# Patient Record
Sex: Male | Born: 1950
Health system: Southern US, Community
[De-identification: ages and names within clinical notes are randomized; demographics above are authoritative.]

## PROBLEM LIST (undated history)

## (undated) DIAGNOSIS — K649 Unspecified hemorrhoids: Secondary | ICD-10-CM

## (undated) DIAGNOSIS — I10 Essential (primary) hypertension: Secondary | ICD-10-CM

## (undated) DIAGNOSIS — K219 Gastro-esophageal reflux disease without esophagitis: Secondary | ICD-10-CM

## (undated) DIAGNOSIS — N400 Enlarged prostate without lower urinary tract symptoms: Secondary | ICD-10-CM

## (undated) DIAGNOSIS — E785 Hyperlipidemia, unspecified: Secondary | ICD-10-CM

## (undated) HISTORY — DX: Benign prostatic hyperplasia without lower urinary tract symptoms: N40.0

## (undated) HISTORY — PX: HEMORRHOID SURGERY: SHX153

## (undated) HISTORY — DX: Hyperlipidemia, unspecified: E78.5

## (undated) HISTORY — PX: CIRCUMCISION: SUR203

## (undated) HISTORY — DX: Essential (primary) hypertension: I10

## (undated) HISTORY — DX: Gastro-esophageal reflux disease without esophagitis: K21.9

## (undated) HISTORY — PX: HAND SURGERY: SHX662

## (undated) HISTORY — DX: Unspecified hemorrhoids: K64.9

## (undated) HISTORY — PX: UMBILICAL HERNIA REPAIR: SHX2598

---

## 2002-03-24 ENCOUNTER — Ambulatory Visit (HOSPITAL_COMMUNITY): Admission: RE | Admit: 2002-03-24 | Discharge: 2002-03-24 | Payer: Self-pay | Admitting: Urology

## 2002-08-20 ENCOUNTER — Encounter: Payer: Self-pay | Admitting: Family Medicine

## 2002-08-20 ENCOUNTER — Ambulatory Visit (HOSPITAL_COMMUNITY): Admission: RE | Admit: 2002-08-20 | Discharge: 2002-08-20 | Payer: Self-pay | Admitting: Family Medicine

## 2002-09-15 ENCOUNTER — Encounter (HOSPITAL_COMMUNITY): Admission: RE | Admit: 2002-09-15 | Discharge: 2002-10-15 | Payer: Self-pay | Admitting: *Deleted

## 2002-09-15 ENCOUNTER — Encounter: Payer: Self-pay | Admitting: *Deleted

## 2009-11-15 ENCOUNTER — Ambulatory Visit (HOSPITAL_COMMUNITY): Admission: RE | Admit: 2009-11-15 | Discharge: 2009-11-15 | Payer: Self-pay | Admitting: Family Medicine

## 2009-11-19 ENCOUNTER — Ambulatory Visit (HOSPITAL_COMMUNITY): Admission: RE | Admit: 2009-11-19 | Discharge: 2009-11-19 | Payer: Self-pay | Admitting: Family Medicine

## 2009-11-23 ENCOUNTER — Ambulatory Visit (HOSPITAL_COMMUNITY): Admission: RE | Admit: 2009-11-23 | Discharge: 2009-11-23 | Payer: Self-pay | Admitting: Orthopedic Surgery

## 2010-01-04 ENCOUNTER — Ambulatory Visit (HOSPITAL_COMMUNITY): Admission: RE | Admit: 2010-01-04 | Discharge: 2010-01-04 | Payer: Self-pay | Admitting: Orthopedic Surgery

## 2011-02-10 NOTE — Op Note (Signed)
[facility]  Patient:    Hunter Cuevas, Hunter Cuevas Visit Number: [id number] MRN: [id number]          Service Type: DSU Location: DAY Attending Physician:  Dennie Maizes Dictated by:   Dennie Maizes, M.D. Proc. Date: 03/24/02 Admit Date:  03/24/2002   CC:         Patrica Duel, M.D.   Operative Report  PREOPERATIVE DIAGNOSIS:  Recurrent balanitis.  POSTOPERATIVE DIAGNOSIS:  Recurrent balanitis.  OPERATIVE PROCEDURE:  Circumcision.  SURGEON:  Dennie Maizes, M.D.  ANESTHESIA:  Spinal and general.  COMPLICATIONS:  None.  ESTIMATED BLOOD LOSS:  Minimal.  INDICATIONS FOR PROCEDURE:  This 60 year old male with a history of recurrent balanitis was taken to the operating room today for circumcision under anesthesia.  DESCRIPTION OF PROCEDURE:  Spinal and general anesthesia were induced and the patient was placed on the OR table in the supine position.  The lower abdomen and genitalia were prepped and draped in a sterile fashion.  The foreskin was then clamped in 6 and 12 oclock positions with the straight hemostats. Dorsal and ventral slits were made.  The redundant foreskin was then excised. Hemostasis was obtained by cauterization.  The edges of the foreskin were then approximated using 4-0 chromic gut.  Vaseline gauze dressing and Coban were applied to the penis.  The patient was transferred to the PACU in a satisfactory condition.  ADDENDUM:  About 3 cc of 0.25% Marcaine were infiltrated subcutaneously around the base of the penis for postoperative analgesia prior to application of the dressing. Dictated by:   Dennie Maizes, M.D. Attending Physician:  Dennie Maizes DD:  03/24/02 TD:  03/25/02 Job: [id number]

## 2011-02-10 NOTE — Op Note (Signed)
   NAME:  DARRIE, MACMILLAN                         ACCOUNT NO.:  [id number]   MEDICAL RECORD NO.:  [id number]                   PATIENT TYPE:  OUT   LOCATION:                                       FACILITY:  [facility]   PHYSICIAN:  Cecil Cranker, M.D. [facility]         DATE OF BIRTH:  03/15/1951   DATE OF PROCEDURE:  09/15/2002  DATE OF DISCHARGE:                                  PROCEDURE NOTE   INDICATION:  Mr. Hohensee is a 60 year old male with multiple cardiac risk  factors; however, no known coronary disease. He presented to our office  complaining of sharp pain under the left arm radiating to the chest. He is  here for evaluation of his chest pain with exercise Cardiolite.   Baseline EKG:  Sinus rhythm at 69 beats per minute, no ischemic changes.  Baseline blood pressure:  158/92.   SUMMARY:  Mr. Bachtel exercised for a total of 5 minutes and 53 seconds to a  Bruce protocol stage II and 7 METS. Cardiolite was injected at 4 minutes and  53 seconds. His target heart rate was 142 beats per minute. He reached a  maximum heart rate of 144 beats per minute. He denies any chest pain during  exercise; however, he did experience some shortness of breath relieved with  rest. There were no ischemic changes on EKG, no arrhythmias noted. His  maximum blood pressure was 220/112 with maximum heart rate at 144 beats per  minute. Final images and results are pending M.D. review.     Amy Mercy Riding, P.A. [facility]                     E. Graceann Congress, M.D. [facility]    AB/MEDQ  D:  09/15/2002  T:  09/16/2002  Job:  [id number]

## 2011-09-28 IMAGING — US US EXTREM LOW VENOUS*L*
1 series · 14 of 24 positions shown · non-contrast
Comparison: None

CLINICAL DATA: LT KNEE/LEG PAIN/EDEMA;;

LEFT LOWER EXTREMITY VENOUS DUPLEX ULTRASOUND
TECHNIQUE: Gray-scale sonography with compression, as well as color
and duplex ultrasound, were performed to evaluate the deep venous
system from the level of the common femoral vein through the
popliteal and proximal calf veins.

[Series 1: unknown · 14 of 26 slices shown]
[im 1/26]
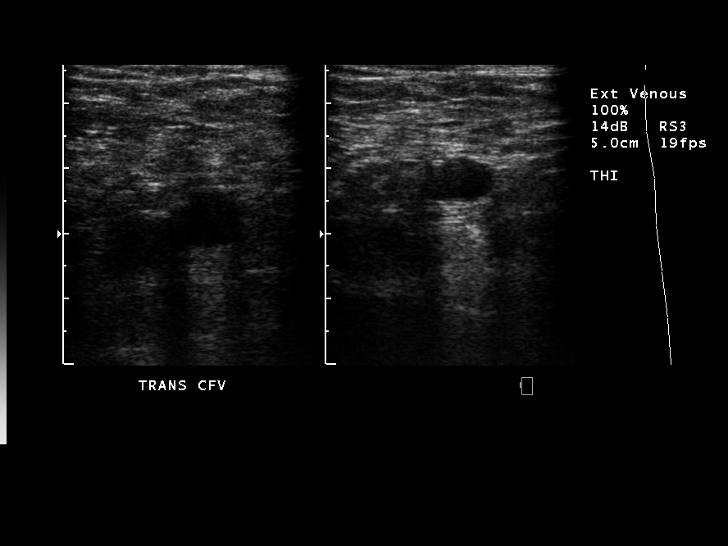
[im 3/26]
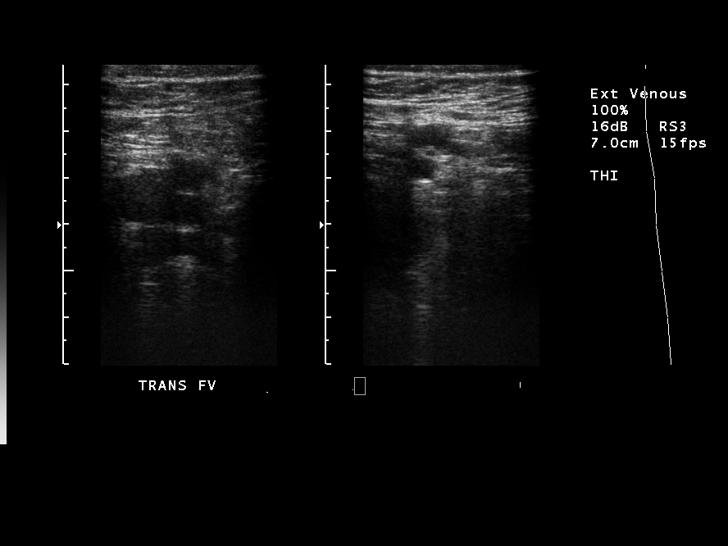
[im 5/26]
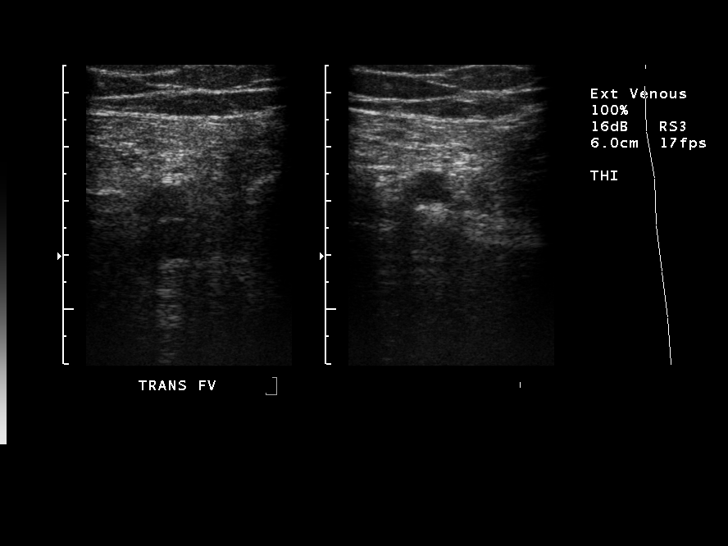
[im 7/26]
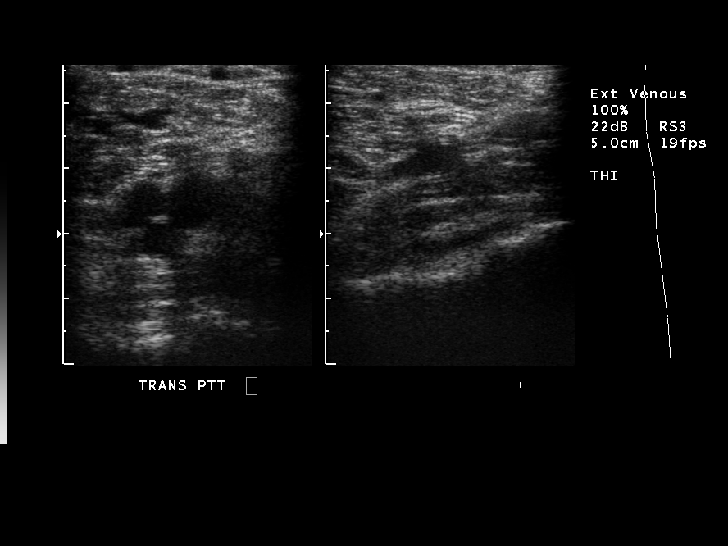
[im 8/26]
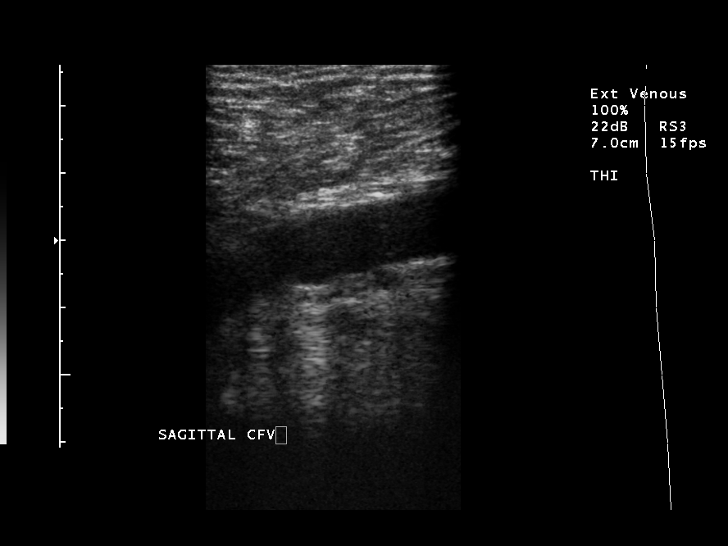
[im 10/26]
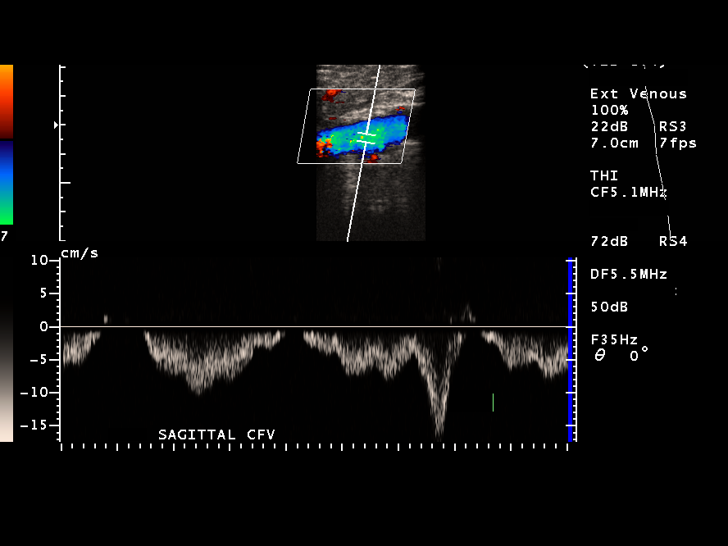
[im 12/26]
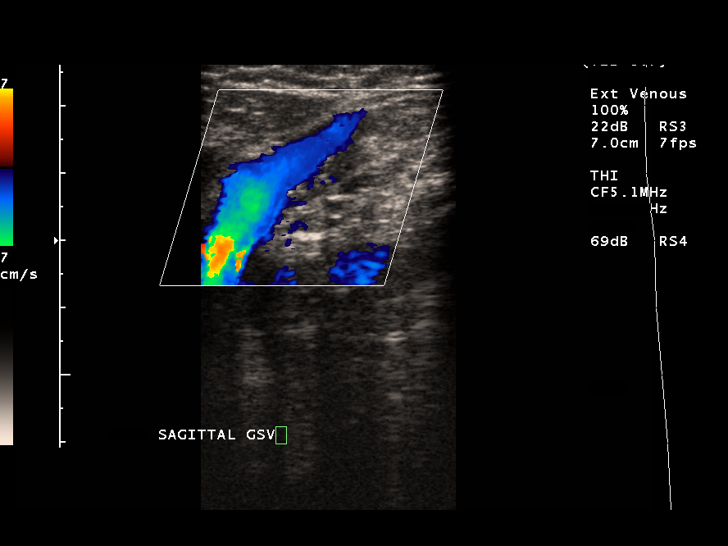
[im 14/26]
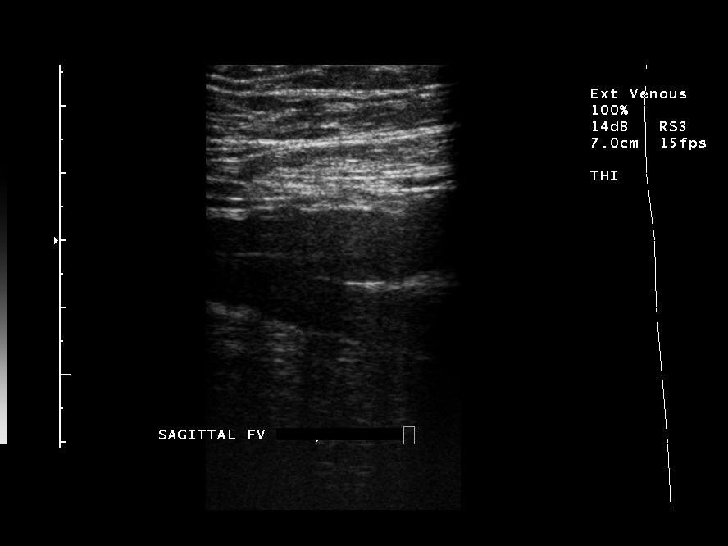
[im 16/26]
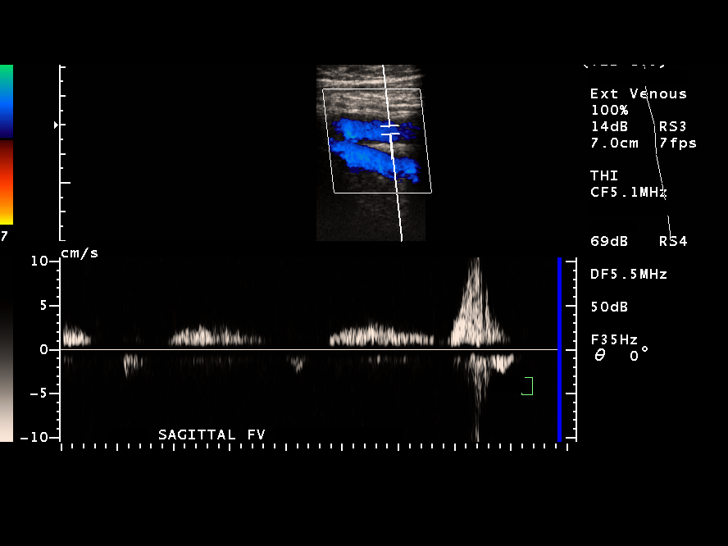
[im 18/26]
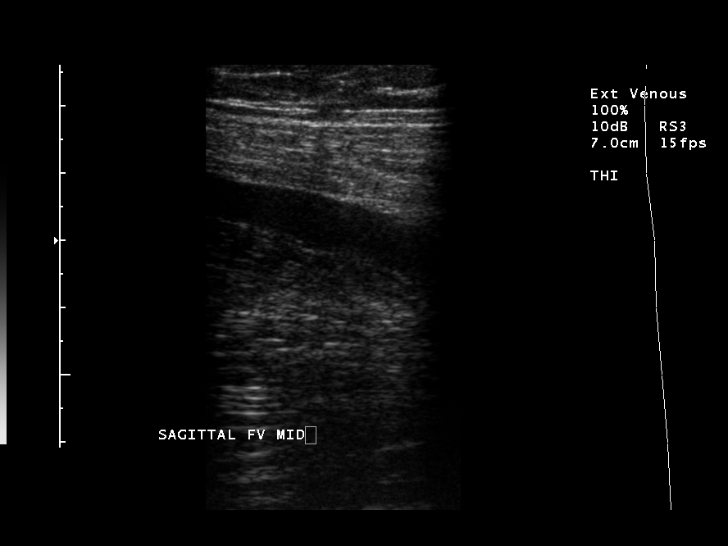
[im 20/26]
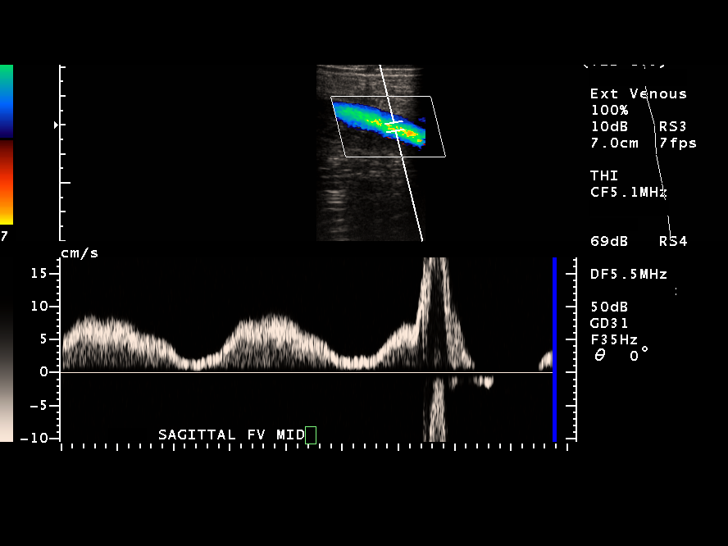
[im 21/26]
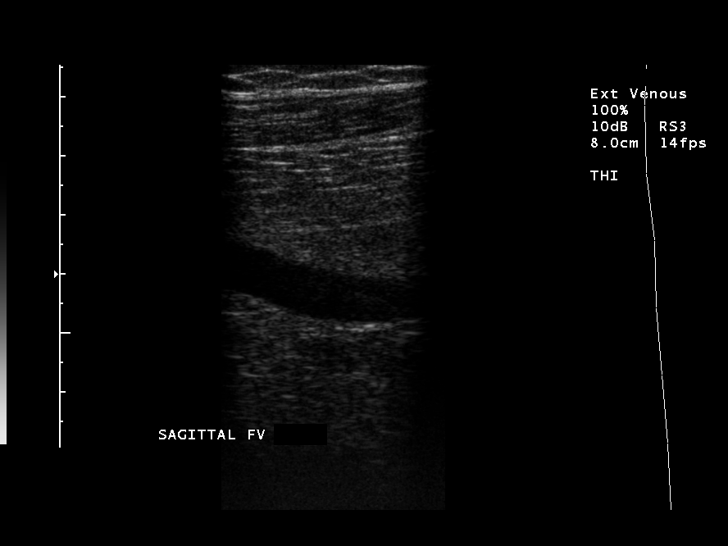
[im 23/26]
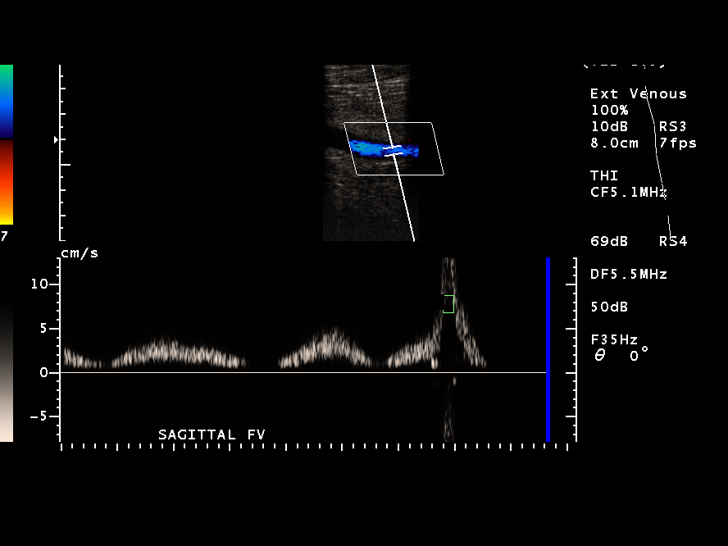
[im 26/26]
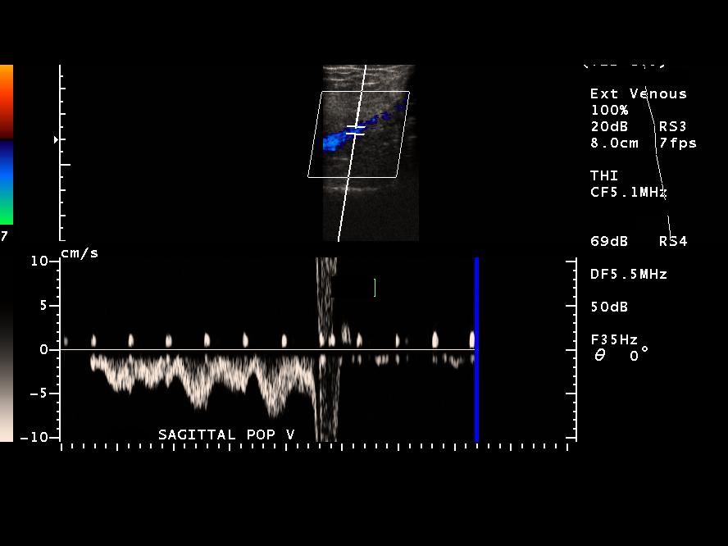

[14 of 24 positions shown; findings below may reference images not displayed]

FINDINGS: Normal compressibility and normal Doppler signal within
the common femoral, superficial femoral and popliteal veins, down
to the proximal calf veins.  No grayscale filling defects to
suggest DVT.
IMPRESSION: No evidence of left lower extremity deep vein thrombosis.

## 2013-04-08 ENCOUNTER — Ambulatory Visit: Payer: Self-pay | Admitting: Internal Medicine

## 2013-04-08 ENCOUNTER — Telehealth: Payer: Self-pay | Admitting: Internal Medicine

## 2013-04-08 NOTE — Telephone Encounter (Signed)
Pt was a no show

## 2013-07-09 ENCOUNTER — Other Ambulatory Visit (HOSPITAL_COMMUNITY): Payer: Self-pay | Admitting: Neurological Surgery

## 2013-07-09 DIAGNOSIS — M419 Scoliosis, unspecified: Secondary | ICD-10-CM

## 2013-07-14 ENCOUNTER — Ambulatory Visit (HOSPITAL_COMMUNITY)
Admission: RE | Admit: 2013-07-14 | Discharge: 2013-07-14 | Disposition: A | Discharge status: Home / Self Care | Payer: Managed Care, Other (non HMO) | Source: Ambulatory Visit | Attending: Neurological Surgery | Admitting: Neurological Surgery

## 2013-07-14 ENCOUNTER — Other Ambulatory Visit (HOSPITAL_COMMUNITY): Payer: Self-pay

## 2013-07-14 DIAGNOSIS — M545 Low back pain, unspecified: Secondary | ICD-10-CM | POA: Insufficient documentation

## 2013-07-14 DIAGNOSIS — M419 Scoliosis, unspecified: Secondary | ICD-10-CM

## 2013-07-14 DIAGNOSIS — M5126 Other intervertebral disc displacement, lumbar region: Secondary | ICD-10-CM | POA: Insufficient documentation

## 2013-09-01 ENCOUNTER — Encounter: Payer: Self-pay | Admitting: Cardiology

## 2015-12-28 DIAGNOSIS — N400 Enlarged prostate without lower urinary tract symptoms: Secondary | ICD-10-CM | POA: Diagnosis not present

## 2015-12-28 DIAGNOSIS — I10 Essential (primary) hypertension: Secondary | ICD-10-CM | POA: Diagnosis not present

## 2015-12-28 DIAGNOSIS — K219 Gastro-esophageal reflux disease without esophagitis: Secondary | ICD-10-CM | POA: Diagnosis not present

## 2015-12-28 DIAGNOSIS — E782 Mixed hyperlipidemia: Secondary | ICD-10-CM | POA: Diagnosis not present

## 2015-12-28 DIAGNOSIS — Z1389 Encounter for screening for other disorder: Secondary | ICD-10-CM | POA: Diagnosis not present

## 2016-04-11 DIAGNOSIS — K219 Gastro-esophageal reflux disease without esophagitis: Secondary | ICD-10-CM | POA: Diagnosis not present

## 2016-04-11 DIAGNOSIS — Z1389 Encounter for screening for other disorder: Secondary | ICD-10-CM | POA: Diagnosis not present

## 2016-04-11 DIAGNOSIS — R7309 Other abnormal glucose: Secondary | ICD-10-CM | POA: Diagnosis not present

## 2016-04-11 DIAGNOSIS — E748 Other specified disorders of carbohydrate metabolism: Secondary | ICD-10-CM | POA: Diagnosis not present

## 2016-04-11 DIAGNOSIS — E6609 Other obesity due to excess calories: Secondary | ICD-10-CM | POA: Diagnosis not present

## 2016-04-11 DIAGNOSIS — Z6830 Body mass index (BMI) 30.0-30.9, adult: Secondary | ICD-10-CM | POA: Diagnosis not present

## 2016-04-11 DIAGNOSIS — R5383 Other fatigue: Secondary | ICD-10-CM | POA: Diagnosis not present

## 2016-04-11 DIAGNOSIS — Z0001 Encounter for general adult medical examination with abnormal findings: Secondary | ICD-10-CM | POA: Diagnosis not present

## 2016-04-11 DIAGNOSIS — N400 Enlarged prostate without lower urinary tract symptoms: Secondary | ICD-10-CM | POA: Diagnosis not present

## 2016-05-31 DIAGNOSIS — Z23 Encounter for immunization: Secondary | ICD-10-CM | POA: Diagnosis not present

## 2016-07-18 DIAGNOSIS — H5203 Hypermetropia, bilateral: Secondary | ICD-10-CM | POA: Diagnosis not present

## 2016-07-18 DIAGNOSIS — H2513 Age-related nuclear cataract, bilateral: Secondary | ICD-10-CM | POA: Diagnosis not present

## 2016-07-18 DIAGNOSIS — H524 Presbyopia: Secondary | ICD-10-CM | POA: Diagnosis not present

## 2016-07-18 DIAGNOSIS — H52223 Regular astigmatism, bilateral: Secondary | ICD-10-CM | POA: Diagnosis not present

## 2016-08-07 ENCOUNTER — Emergency Department (HOSPITAL_COMMUNITY): Payer: Medicare Other

## 2016-08-07 ENCOUNTER — Emergency Department (HOSPITAL_COMMUNITY)
Admission: EM | Admit: 2016-08-07 | Discharge: 2016-08-07 | Disposition: A | Discharge status: Home / Self Care | Payer: Medicare Other | Attending: Emergency Medicine | Admitting: Emergency Medicine

## 2016-08-07 ENCOUNTER — Encounter (HOSPITAL_COMMUNITY): Payer: Self-pay | Admitting: Emergency Medicine

## 2016-08-07 DIAGNOSIS — K5790 Diverticulosis of intestine, part unspecified, without perforation or abscess without bleeding: Secondary | ICD-10-CM | POA: Diagnosis not present

## 2016-08-07 DIAGNOSIS — R1110 Vomiting, unspecified: Secondary | ICD-10-CM | POA: Insufficient documentation

## 2016-08-07 DIAGNOSIS — I10 Essential (primary) hypertension: Secondary | ICD-10-CM | POA: Diagnosis not present

## 2016-08-07 DIAGNOSIS — Z79899 Other long term (current) drug therapy: Secondary | ICD-10-CM | POA: Diagnosis not present

## 2016-08-07 DIAGNOSIS — Z1389 Encounter for screening for other disorder: Secondary | ICD-10-CM | POA: Diagnosis not present

## 2016-08-07 DIAGNOSIS — R112 Nausea with vomiting, unspecified: Secondary | ICD-10-CM | POA: Diagnosis not present

## 2016-08-07 DIAGNOSIS — R1013 Epigastric pain: Secondary | ICD-10-CM | POA: Insufficient documentation

## 2016-08-07 DIAGNOSIS — R197 Diarrhea, unspecified: Secondary | ICD-10-CM | POA: Diagnosis not present

## 2016-08-07 DIAGNOSIS — Z87891 Personal history of nicotine dependence: Secondary | ICD-10-CM | POA: Insufficient documentation

## 2016-08-07 LAB — COMPREHENSIVE METABOLIC PANEL
ALT: 18 U/L (ref 17–63)
AST: 16 U/L (ref 15–41)
Albumin: 4.9 g/dL (ref 3.5–5.0)
Alkaline Phosphatase: 73 U/L (ref 38–126)
Anion gap: 11 (ref 5–15)
BUN: 16 mg/dL (ref 6–20)
CHLORIDE: 102 mmol/L (ref 101–111)
CO2: 27 mmol/L (ref 22–32)
CREATININE: 0.92 mg/dL (ref 0.61–1.24)
Calcium: 10.4 mg/dL — ABNORMAL HIGH (ref 8.9–10.3)
Glucose, Bld: 99 mg/dL (ref 65–99)
Potassium: 4.1 mmol/L (ref 3.5–5.1)
Sodium: 140 mmol/L (ref 135–145)
Total Bilirubin: 1 mg/dL (ref 0.3–1.2)
Total Protein: 8 g/dL (ref 6.5–8.1)

## 2016-08-07 LAB — CBC WITH DIFFERENTIAL/PLATELET
BASOS ABS: 0 10*3/uL (ref 0.0–0.1)
BASOS PCT: 0 %
EOS ABS: 0 10*3/uL (ref 0.0–0.7)
EOS PCT: 0 %
HCT: 48.6 % (ref 39.0–52.0)
Hemoglobin: 16.5 g/dL (ref 13.0–17.0)
LYMPHS PCT: 22 %
Lymphs Abs: 2.2 10*3/uL (ref 0.7–4.0)
MCH: 31.2 pg (ref 26.0–34.0)
MCHC: 34 g/dL (ref 30.0–36.0)
MCV: 91.9 fL (ref 78.0–100.0)
Monocytes Absolute: 1 10*3/uL (ref 0.1–1.0)
Monocytes Relative: 10 %
Neutro Abs: 6.7 10*3/uL (ref 1.7–7.7)
Neutrophils Relative %: 68 %
PLATELETS: 286 10*3/uL (ref 150–400)
RBC: 5.29 MIL/uL (ref 4.22–5.81)
RDW: 12.6 % (ref 11.5–15.5)
WBC: 9.9 10*3/uL (ref 4.0–10.5)

## 2016-08-07 LAB — LIPASE, BLOOD: LIPASE: 28 U/L (ref 11–51)

## 2016-08-07 MED ORDER — IOPAMIDOL (ISOVUE-300) INJECTION 61%
INTRAVENOUS | Status: AC
  Administered 2016-08-07: 30 mL
  Filled 2016-08-07: qty 30

## 2016-08-07 MED ORDER — IOPAMIDOL (ISOVUE-300) INJECTION 61%
100.0000 mL | Freq: Once | INTRAVENOUS | Status: AC | PRN
  Administered 2016-08-07: 100 mL via INTRAVENOUS

## 2016-08-07 MED ORDER — ONDANSETRON 8 MG PO TBDP: ORAL_TABLET | ORAL | 0 refills | Status: DC

## 2016-08-07 MED ORDER — PROMETHAZINE HCL 25 MG/ML IJ SOLN
12.5000 mg | Freq: Once | INTRAMUSCULAR | Status: AC
  Administered 2016-08-07: 12.5 mg via INTRAVENOUS
  Filled 2016-08-07: qty 1

## 2016-08-07 MED ORDER — SODIUM CHLORIDE 0.9 % IV BOLUS (SEPSIS)
1000.0000 mL | Freq: Once | INTRAVENOUS | Status: AC
  Administered 2016-08-07: 1000 mL via INTRAVENOUS

## 2016-08-07 MED ORDER — ONDANSETRON HCL 4 MG/2ML IJ SOLN
4.0000 mg | Freq: Once | INTRAMUSCULAR | Status: AC
  Administered 2016-08-07: 4 mg via INTRAVENOUS
  Filled 2016-08-07: qty 2

## 2016-08-07 MED ORDER — OMEPRAZOLE 20 MG PO CPDR: 20.0000 mg | DELAYED_RELEASE_CAPSULE | Freq: Every day | ORAL | 1 refills | Status: DC

## 2016-08-07 MED ORDER — KETOROLAC TROMETHAMINE 30 MG/ML IJ SOLN
30.0000 mg | Freq: Once | INTRAMUSCULAR | Status: AC
  Administered 2016-08-07: 30 mg via INTRAVENOUS
  Filled 2016-08-07: qty 1

## 2016-08-07 NOTE — Discharge Instructions (Signed)
Prilosec as prescribed. Zofran as prescribed as needed for nausea.  Return to the emergency department if you develop worsening pain, high fever, bloody stools, or other new and concerning symptoms.

## 2016-08-07 NOTE — ED Provider Notes (Signed)
[facility] DEPT Provider Note   CSN: [id number] Arrival date & time: 08/07/16  [id number]  By signing my name below, I, Higinio Plan, attest that this documentation has been prepared under the direction and in the presence of Veryl Speak, MD . Electronically Signed: Higinio Plan, Scribe. 08/07/2016. 8:40 AM.  History   Chief Complaint Chief Complaint  Patient presents with  . Emesis   The history is provided by the patient. No language interpreter was used.   HPI Comments: Hunter Cuevas is a 65 y.o. male with PMHx of HTN and HLD, who presents to the Emergency Department complaining of gradually worsening, epigastric abdominal pain that began 4 days ago. Pt reports multiple episodes of vomiting and watery diarrhea that also began 4 days ago. He notes he has not been able to keep any foods or liquids down. He states hx of similar symptoms that he associates with a bleeding ulcer that was diagnosed at 65 years old. He reports he usually treats these episodes by "not eating spicy foods and it will eventually disappear on its own." He denies sick contacts, fever and PSHx to his abdomen.  Past Medical History:  Diagnosis Date  . BPH (benign prostatic hyperplasia)   . Hemorrhoids   . HTN (hypertension)   . Hyperlipidemia, mild    There are no active problems to display for this patient.  Past Surgical History:  Procedure Laterality Date  . CIRCUMCISION    . HAND SURGERY     R and L  . HEMORRHOID SURGERY      Home Medications    Prior to Admission medications   Not on File    Family History No family history on file.  Social History Social History  Substance Use Topics  . Smoking status: Former Research scientist (life sciences)  . Smokeless tobacco: Never Used  . Alcohol use No   Allergies   Penicillins  Review of Systems Review of Systems  Constitutional: Negative for fever.  Gastrointestinal: Positive for abdominal pain, diarrhea and vomiting.   Physical Exam Updated Vital Signs BP 129/99  (BP Location: Right Arm)   Pulse (!) 58   Temp 97.8 F (36.6 C) (Oral)   Resp 20   Ht 5\' 9"  (1.753 m)   Wt 178 lb (80.7 kg)   SpO2 97%   BMI 26.29 kg/m   Physical Exam  Constitutional: He is oriented to person, place, and time. He appears well-developed and well-nourished.  HENT:  Head: Normocephalic and atraumatic.  Mouth/Throat: Oropharynx is clear and moist.  Eyes: Conjunctivae and EOM are normal.  Neck: Normal range of motion. No tracheal deviation present.  Cardiovascular: Normal rate, regular rhythm, normal heart sounds and intact distal pulses.   No murmur heard. Pulmonary/Chest: Effort normal and breath sounds normal. No respiratory distress.  Abdominal: Soft. He exhibits no distension. There is no tenderness. There is no rebound and no guarding.  TTP in epigastric region  Musculoskeletal: Normal range of motion.  Neurological: He is alert and oriented to person, place, and time.  Skin: Skin is warm and dry.  Psychiatric: He has a normal mood and affect. Judgment normal.  Nursing note and vitals reviewed.  ED Treatments / Results  Labs (all labs ordered are listed, but only abnormal results are displayed) Labs Reviewed - No data to display  EKG  EKG Interpretation None       Radiology No results found.  Procedures Procedures (including critical care time)  Medications Ordered in ED Medications - No data to  display  DIAGNOSTIC STUDIES:  Oxygen Saturation is 97% on RA, normal by my interpretation.    COORDINATION OF CARE:  8:37 AM Discussed treatment plan with pt at bedside and pt agreed to plan.  Initial Impression / Assessment and Plan / ED Course  I have reviewed the triage vital signs and the nursing notes.  Pertinent labs & imaging results that were available during my care of the patient were reviewed by me and considered in my medical decision making (see chart for details).  Clinical Course     Patient presents here with complaints of  abdominal discomfort and vomiting. He has a history of an ulcer that he reports he has had since the age of 62. He reports occasional flareups such as this related to the ulcer. His workup today reveals no acute abnormality. He has no white count, his electrolytes and LFTs are normal, and CT scan of the abdomen and pelvis reveals no acute intra-abdominal pathology. He reports continued abdominal discomfort, however has not vomited the entire time he has been in the emergency department.  He appears clinically well, and with normal workup I believe is appropriate for discharge. He will be given Prilosec, Zofran, and advised to follow-up with his primary Dr. if he is not improving in the next 2-3 days. He is to return to the ER physician symptoms significantly worsen. His abdomen was re-examined and remains benign.  I personally performed the services described in this documentation, which was scribed in my presence. The recorded information has been reviewed and is accurate.   Final Clinical Impressions(s) / ED Diagnoses   Final diagnoses:  None    New Prescriptions New Prescriptions   No medications on file     Veryl Speak, MD 08/07/16 1340

## 2016-08-07 NOTE — ED Notes (Signed)
Pt to CT at this time.

## 2016-08-07 NOTE — ED Triage Notes (Signed)
Abdominal pain and vomiting started on Friday. Pt states he has been taking ibuprofen for arthritis pain. Pt has Hx of bleeding ulcers.

## 2016-10-17 DIAGNOSIS — Z6828 Body mass index (BMI) 28.0-28.9, adult: Secondary | ICD-10-CM | POA: Diagnosis not present

## 2016-10-17 DIAGNOSIS — J9801 Acute bronchospasm: Secondary | ICD-10-CM | POA: Diagnosis not present

## 2016-10-17 DIAGNOSIS — Z1389 Encounter for screening for other disorder: Secondary | ICD-10-CM | POA: Diagnosis not present

## 2016-10-17 DIAGNOSIS — J329 Chronic sinusitis, unspecified: Secondary | ICD-10-CM | POA: Diagnosis not present

## 2016-10-17 DIAGNOSIS — I10 Essential (primary) hypertension: Secondary | ICD-10-CM | POA: Diagnosis not present

## 2016-10-17 DIAGNOSIS — L309 Dermatitis, unspecified: Secondary | ICD-10-CM | POA: Diagnosis not present

## 2016-10-17 DIAGNOSIS — N400 Enlarged prostate without lower urinary tract symptoms: Secondary | ICD-10-CM | POA: Diagnosis not present

## 2017-04-12 DIAGNOSIS — Z23 Encounter for immunization: Secondary | ICD-10-CM | POA: Diagnosis not present

## 2017-04-12 DIAGNOSIS — Z6829 Body mass index (BMI) 29.0-29.9, adult: Secondary | ICD-10-CM | POA: Diagnosis not present

## 2017-04-12 DIAGNOSIS — Z1389 Encounter for screening for other disorder: Secondary | ICD-10-CM | POA: Diagnosis not present

## 2017-04-12 DIAGNOSIS — E663 Overweight: Secondary | ICD-10-CM | POA: Diagnosis not present

## 2017-04-12 DIAGNOSIS — K219 Gastro-esophageal reflux disease without esophagitis: Secondary | ICD-10-CM | POA: Diagnosis not present

## 2017-04-12 DIAGNOSIS — Z0001 Encounter for general adult medical examination with abnormal findings: Secondary | ICD-10-CM | POA: Diagnosis not present

## 2017-04-12 DIAGNOSIS — N400 Enlarged prostate without lower urinary tract symptoms: Secondary | ICD-10-CM | POA: Diagnosis not present

## 2017-04-12 DIAGNOSIS — I10 Essential (primary) hypertension: Secondary | ICD-10-CM | POA: Diagnosis not present

## 2017-08-08 DIAGNOSIS — Z23 Encounter for immunization: Secondary | ICD-10-CM | POA: Diagnosis not present

## 2017-08-13 DIAGNOSIS — L570 Actinic keratosis: Secondary | ICD-10-CM | POA: Diagnosis not present

## 2017-08-13 DIAGNOSIS — X32XXXA Exposure to sunlight, initial encounter: Secondary | ICD-10-CM | POA: Diagnosis not present

## 2017-08-13 DIAGNOSIS — D225 Melanocytic nevi of trunk: Secondary | ICD-10-CM | POA: Diagnosis not present

## 2018-05-14 DIAGNOSIS — I10 Essential (primary) hypertension: Secondary | ICD-10-CM | POA: Diagnosis not present

## 2018-05-14 DIAGNOSIS — E669 Obesity, unspecified: Secondary | ICD-10-CM | POA: Diagnosis not present

## 2018-05-14 DIAGNOSIS — K219 Gastro-esophageal reflux disease without esophagitis: Secondary | ICD-10-CM | POA: Diagnosis not present

## 2018-05-14 DIAGNOSIS — E782 Mixed hyperlipidemia: Secondary | ICD-10-CM | POA: Diagnosis not present

## 2018-05-14 DIAGNOSIS — Z23 Encounter for immunization: Secondary | ICD-10-CM | POA: Diagnosis not present

## 2018-05-14 DIAGNOSIS — F419 Anxiety disorder, unspecified: Secondary | ICD-10-CM | POA: Diagnosis not present

## 2018-05-14 DIAGNOSIS — N400 Enlarged prostate without lower urinary tract symptoms: Secondary | ICD-10-CM | POA: Diagnosis not present

## 2018-05-14 DIAGNOSIS — Z0000 Encounter for general adult medical examination without abnormal findings: Secondary | ICD-10-CM | POA: Diagnosis not present

## 2018-05-14 DIAGNOSIS — Z6828 Body mass index (BMI) 28.0-28.9, adult: Secondary | ICD-10-CM | POA: Diagnosis not present

## 2018-05-23 DIAGNOSIS — Z1211 Encounter for screening for malignant neoplasm of colon: Secondary | ICD-10-CM | POA: Diagnosis not present

## 2018-07-30 DIAGNOSIS — Z23 Encounter for immunization: Secondary | ICD-10-CM | POA: Diagnosis not present

## 2018-07-30 DIAGNOSIS — J329 Chronic sinusitis, unspecified: Secondary | ICD-10-CM | POA: Diagnosis not present

## 2018-07-30 DIAGNOSIS — N400 Enlarged prostate without lower urinary tract symptoms: Secondary | ICD-10-CM | POA: Diagnosis not present

## 2018-07-30 DIAGNOSIS — Z6829 Body mass index (BMI) 29.0-29.9, adult: Secondary | ICD-10-CM | POA: Diagnosis not present

## 2018-07-30 DIAGNOSIS — Z1389 Encounter for screening for other disorder: Secondary | ICD-10-CM | POA: Diagnosis not present

## 2018-07-30 DIAGNOSIS — H6591 Unspecified nonsuppurative otitis media, right ear: Secondary | ICD-10-CM | POA: Diagnosis not present

## 2019-01-01 DIAGNOSIS — Z1389 Encounter for screening for other disorder: Secondary | ICD-10-CM | POA: Diagnosis not present

## 2019-01-01 DIAGNOSIS — I10 Essential (primary) hypertension: Secondary | ICD-10-CM | POA: Diagnosis not present

## 2019-01-01 DIAGNOSIS — F419 Anxiety disorder, unspecified: Secondary | ICD-10-CM | POA: Diagnosis not present

## 2019-01-01 DIAGNOSIS — K219 Gastro-esophageal reflux disease without esophagitis: Secondary | ICD-10-CM | POA: Diagnosis not present

## 2019-01-01 DIAGNOSIS — Z6829 Body mass index (BMI) 29.0-29.9, adult: Secondary | ICD-10-CM | POA: Diagnosis not present

## 2019-01-13 DIAGNOSIS — Z1389 Encounter for screening for other disorder: Secondary | ICD-10-CM | POA: Diagnosis not present

## 2019-01-13 DIAGNOSIS — Z6828 Body mass index (BMI) 28.0-28.9, adult: Secondary | ICD-10-CM | POA: Diagnosis not present

## 2019-01-13 DIAGNOSIS — I10 Essential (primary) hypertension: Secondary | ICD-10-CM | POA: Diagnosis not present

## 2019-01-13 DIAGNOSIS — K219 Gastro-esophageal reflux disease without esophagitis: Secondary | ICD-10-CM | POA: Diagnosis not present

## 2019-01-13 DIAGNOSIS — R109 Unspecified abdominal pain: Secondary | ICD-10-CM | POA: Diagnosis not present

## 2019-01-13 DIAGNOSIS — R1013 Epigastric pain: Secondary | ICD-10-CM | POA: Diagnosis not present

## 2019-01-13 DIAGNOSIS — E663 Overweight: Secondary | ICD-10-CM | POA: Diagnosis not present

## 2019-01-13 DIAGNOSIS — K273 Acute peptic ulcer, site unspecified, without hemorrhage or perforation: Secondary | ICD-10-CM | POA: Diagnosis not present

## 2019-02-12 DIAGNOSIS — Z1389 Encounter for screening for other disorder: Secondary | ICD-10-CM | POA: Diagnosis not present

## 2019-02-12 DIAGNOSIS — Z0000 Encounter for general adult medical examination without abnormal findings: Secondary | ICD-10-CM | POA: Diagnosis not present

## 2019-02-12 DIAGNOSIS — F419 Anxiety disorder, unspecified: Secondary | ICD-10-CM | POA: Diagnosis not present

## 2019-02-12 DIAGNOSIS — Z681 Body mass index (BMI) 19 or less, adult: Secondary | ICD-10-CM | POA: Diagnosis not present

## 2019-02-12 DIAGNOSIS — E7849 Other hyperlipidemia: Secondary | ICD-10-CM | POA: Diagnosis not present

## 2019-04-10 DIAGNOSIS — H5200 Hypermetropia, unspecified eye: Secondary | ICD-10-CM | POA: Diagnosis not present

## 2019-04-10 DIAGNOSIS — Z0100 Encounter for examination of eyes and vision without abnormal findings: Secondary | ICD-10-CM | POA: Diagnosis not present

## 2019-04-15 DIAGNOSIS — I10 Essential (primary) hypertension: Secondary | ICD-10-CM | POA: Diagnosis not present

## 2019-04-15 DIAGNOSIS — Z1389 Encounter for screening for other disorder: Secondary | ICD-10-CM | POA: Diagnosis not present

## 2019-04-15 DIAGNOSIS — F17290 Nicotine dependence, other tobacco product, uncomplicated: Secondary | ICD-10-CM | POA: Diagnosis not present

## 2019-04-15 DIAGNOSIS — K219 Gastro-esophageal reflux disease without esophagitis: Secondary | ICD-10-CM | POA: Diagnosis not present

## 2019-04-15 DIAGNOSIS — Z6829 Body mass index (BMI) 29.0-29.9, adult: Secondary | ICD-10-CM | POA: Diagnosis not present

## 2019-04-15 DIAGNOSIS — Z719 Counseling, unspecified: Secondary | ICD-10-CM | POA: Diagnosis not present

## 2019-04-15 DIAGNOSIS — F419 Anxiety disorder, unspecified: Secondary | ICD-10-CM | POA: Diagnosis not present

## 2019-06-25 DIAGNOSIS — I10 Essential (primary) hypertension: Secondary | ICD-10-CM | POA: Diagnosis not present

## 2019-06-25 DIAGNOSIS — E782 Mixed hyperlipidemia: Secondary | ICD-10-CM | POA: Diagnosis not present

## 2019-06-25 DIAGNOSIS — N400 Enlarged prostate without lower urinary tract symptoms: Secondary | ICD-10-CM | POA: Diagnosis not present

## 2019-07-30 DIAGNOSIS — E6609 Other obesity due to excess calories: Secondary | ICD-10-CM | POA: Diagnosis not present

## 2019-07-30 DIAGNOSIS — Z23 Encounter for immunization: Secondary | ICD-10-CM | POA: Diagnosis not present

## 2019-07-30 DIAGNOSIS — I10 Essential (primary) hypertension: Secondary | ICD-10-CM | POA: Diagnosis not present

## 2019-07-30 DIAGNOSIS — K219 Gastro-esophageal reflux disease without esophagitis: Secondary | ICD-10-CM | POA: Diagnosis not present

## 2019-07-30 DIAGNOSIS — E7849 Other hyperlipidemia: Secondary | ICD-10-CM | POA: Diagnosis not present

## 2019-07-30 DIAGNOSIS — Z6830 Body mass index (BMI) 30.0-30.9, adult: Secondary | ICD-10-CM | POA: Diagnosis not present

## 2019-08-25 DIAGNOSIS — I10 Essential (primary) hypertension: Secondary | ICD-10-CM | POA: Diagnosis not present

## 2019-08-25 DIAGNOSIS — E7849 Other hyperlipidemia: Secondary | ICD-10-CM | POA: Diagnosis not present

## 2019-10-26 DIAGNOSIS — I10 Essential (primary) hypertension: Secondary | ICD-10-CM | POA: Diagnosis not present

## 2019-10-26 DIAGNOSIS — E7849 Other hyperlipidemia: Secondary | ICD-10-CM | POA: Diagnosis not present

## 2019-11-23 DIAGNOSIS — E7849 Other hyperlipidemia: Secondary | ICD-10-CM | POA: Diagnosis not present

## 2019-11-23 DIAGNOSIS — I10 Essential (primary) hypertension: Secondary | ICD-10-CM | POA: Diagnosis not present

## 2019-12-24 DIAGNOSIS — I10 Essential (primary) hypertension: Secondary | ICD-10-CM | POA: Diagnosis not present

## 2019-12-24 DIAGNOSIS — E7849 Other hyperlipidemia: Secondary | ICD-10-CM | POA: Diagnosis not present

## 2020-02-23 DIAGNOSIS — I10 Essential (primary) hypertension: Secondary | ICD-10-CM | POA: Diagnosis not present

## 2020-02-23 DIAGNOSIS — E7849 Other hyperlipidemia: Secondary | ICD-10-CM | POA: Diagnosis not present

## 2020-03-17 DIAGNOSIS — E7849 Other hyperlipidemia: Secondary | ICD-10-CM | POA: Diagnosis not present

## 2020-03-17 DIAGNOSIS — K219 Gastro-esophageal reflux disease without esophagitis: Secondary | ICD-10-CM | POA: Diagnosis not present

## 2020-03-17 DIAGNOSIS — M79672 Pain in left foot: Secondary | ICD-10-CM | POA: Diagnosis not present

## 2020-03-17 DIAGNOSIS — E6609 Other obesity due to excess calories: Secondary | ICD-10-CM | POA: Diagnosis not present

## 2020-03-17 DIAGNOSIS — Z0000 Encounter for general adult medical examination without abnormal findings: Secondary | ICD-10-CM | POA: Diagnosis not present

## 2020-03-17 DIAGNOSIS — F419 Anxiety disorder, unspecified: Secondary | ICD-10-CM | POA: Diagnosis not present

## 2020-03-17 DIAGNOSIS — I10 Essential (primary) hypertension: Secondary | ICD-10-CM | POA: Diagnosis not present

## 2020-03-17 DIAGNOSIS — Z6830 Body mass index (BMI) 30.0-30.9, adult: Secondary | ICD-10-CM | POA: Diagnosis not present

## 2020-03-22 ENCOUNTER — Other Ambulatory Visit: Payer: Self-pay | Admitting: Internal Medicine

## 2020-03-22 ENCOUNTER — Other Ambulatory Visit (HOSPITAL_COMMUNITY): Payer: Self-pay | Admitting: Internal Medicine

## 2020-03-22 DIAGNOSIS — M79604 Pain in right leg: Secondary | ICD-10-CM

## 2020-03-26 ENCOUNTER — Other Ambulatory Visit: Payer: Self-pay

## 2020-03-26 ENCOUNTER — Ambulatory Visit (HOSPITAL_COMMUNITY)
Admission: RE | Admit: 2020-03-26 | Discharge: 2020-03-26 | Disposition: A | Discharge status: Home / Self Care | Payer: Medicare HMO | Source: Ambulatory Visit | Attending: Internal Medicine | Admitting: Internal Medicine

## 2020-03-26 DIAGNOSIS — M79605 Pain in left leg: Secondary | ICD-10-CM | POA: Diagnosis not present

## 2020-03-26 DIAGNOSIS — Z872 Personal history of diseases of the skin and subcutaneous tissue: Secondary | ICD-10-CM | POA: Diagnosis not present

## 2020-03-26 DIAGNOSIS — M79604 Pain in right leg: Secondary | ICD-10-CM | POA: Diagnosis not present

## 2020-05-25 DIAGNOSIS — E7849 Other hyperlipidemia: Secondary | ICD-10-CM | POA: Diagnosis not present

## 2020-05-25 DIAGNOSIS — I10 Essential (primary) hypertension: Secondary | ICD-10-CM | POA: Diagnosis not present

## 2020-05-25 DIAGNOSIS — K219 Gastro-esophageal reflux disease without esophagitis: Secondary | ICD-10-CM | POA: Diagnosis not present

## 2020-07-24 DIAGNOSIS — I10 Essential (primary) hypertension: Secondary | ICD-10-CM | POA: Diagnosis not present

## 2020-07-24 DIAGNOSIS — E7849 Other hyperlipidemia: Secondary | ICD-10-CM | POA: Diagnosis not present

## 2020-08-24 DIAGNOSIS — I10 Essential (primary) hypertension: Secondary | ICD-10-CM | POA: Diagnosis not present

## 2020-08-24 DIAGNOSIS — E7849 Other hyperlipidemia: Secondary | ICD-10-CM | POA: Diagnosis not present

## 2020-09-24 DIAGNOSIS — I10 Essential (primary) hypertension: Secondary | ICD-10-CM | POA: Diagnosis not present

## 2020-09-24 DIAGNOSIS — E7849 Other hyperlipidemia: Secondary | ICD-10-CM | POA: Diagnosis not present

## 2020-10-23 DIAGNOSIS — I10 Essential (primary) hypertension: Secondary | ICD-10-CM | POA: Diagnosis not present

## 2020-10-23 DIAGNOSIS — E7849 Other hyperlipidemia: Secondary | ICD-10-CM | POA: Diagnosis not present

## 2020-10-27 DIAGNOSIS — I10 Essential (primary) hypertension: Secondary | ICD-10-CM | POA: Diagnosis not present

## 2020-10-27 DIAGNOSIS — F419 Anxiety disorder, unspecified: Secondary | ICD-10-CM | POA: Diagnosis not present

## 2020-10-27 DIAGNOSIS — K219 Gastro-esophageal reflux disease without esophagitis: Secondary | ICD-10-CM | POA: Diagnosis not present

## 2020-10-27 DIAGNOSIS — N410 Acute prostatitis: Secondary | ICD-10-CM | POA: Diagnosis not present

## 2020-10-27 DIAGNOSIS — Z6830 Body mass index (BMI) 30.0-30.9, adult: Secondary | ICD-10-CM | POA: Diagnosis not present

## 2020-10-27 DIAGNOSIS — N401 Enlarged prostate with lower urinary tract symptoms: Secondary | ICD-10-CM | POA: Diagnosis not present

## 2020-10-27 DIAGNOSIS — Z1331 Encounter for screening for depression: Secondary | ICD-10-CM | POA: Diagnosis not present

## 2020-11-09 ENCOUNTER — Encounter (INDEPENDENT_AMBULATORY_CARE_PROVIDER_SITE_OTHER): Payer: Self-pay | Admitting: *Deleted

## 2020-11-22 DIAGNOSIS — E7849 Other hyperlipidemia: Secondary | ICD-10-CM | POA: Diagnosis not present

## 2020-11-22 DIAGNOSIS — I10 Essential (primary) hypertension: Secondary | ICD-10-CM | POA: Diagnosis not present

## 2020-12-08 DIAGNOSIS — D485 Neoplasm of uncertain behavior of skin: Secondary | ICD-10-CM | POA: Diagnosis not present

## 2020-12-08 DIAGNOSIS — Z6830 Body mass index (BMI) 30.0-30.9, adult: Secondary | ICD-10-CM | POA: Diagnosis not present

## 2020-12-08 DIAGNOSIS — Z1389 Encounter for screening for other disorder: Secondary | ICD-10-CM | POA: Diagnosis not present

## 2020-12-08 DIAGNOSIS — F419 Anxiety disorder, unspecified: Secondary | ICD-10-CM | POA: Diagnosis not present

## 2020-12-08 DIAGNOSIS — K219 Gastro-esophageal reflux disease without esophagitis: Secondary | ICD-10-CM | POA: Diagnosis not present

## 2020-12-08 DIAGNOSIS — E6609 Other obesity due to excess calories: Secondary | ICD-10-CM | POA: Diagnosis not present

## 2020-12-08 DIAGNOSIS — E7849 Other hyperlipidemia: Secondary | ICD-10-CM | POA: Diagnosis not present

## 2020-12-08 DIAGNOSIS — I10 Essential (primary) hypertension: Secondary | ICD-10-CM | POA: Diagnosis not present

## 2020-12-08 DIAGNOSIS — J329 Chronic sinusitis, unspecified: Secondary | ICD-10-CM | POA: Diagnosis not present

## 2020-12-08 DIAGNOSIS — Z0000 Encounter for general adult medical examination without abnormal findings: Secondary | ICD-10-CM | POA: Diagnosis not present

## 2020-12-08 DIAGNOSIS — H6523 Chronic serous otitis media, bilateral: Secondary | ICD-10-CM | POA: Diagnosis not present

## 2021-01-03 DIAGNOSIS — L821 Other seborrheic keratosis: Secondary | ICD-10-CM | POA: Diagnosis not present

## 2021-01-03 DIAGNOSIS — L820 Inflamed seborrheic keratosis: Secondary | ICD-10-CM | POA: Diagnosis not present

## 2021-01-03 DIAGNOSIS — D225 Melanocytic nevi of trunk: Secondary | ICD-10-CM | POA: Diagnosis not present

## 2021-01-22 DIAGNOSIS — E7849 Other hyperlipidemia: Secondary | ICD-10-CM | POA: Diagnosis not present

## 2021-01-22 DIAGNOSIS — I10 Essential (primary) hypertension: Secondary | ICD-10-CM | POA: Diagnosis not present

## 2021-05-25 DIAGNOSIS — I10 Essential (primary) hypertension: Secondary | ICD-10-CM | POA: Diagnosis not present

## 2021-05-25 DIAGNOSIS — E7849 Other hyperlipidemia: Secondary | ICD-10-CM | POA: Diagnosis not present

## 2021-08-25 DIAGNOSIS — I10 Essential (primary) hypertension: Secondary | ICD-10-CM | POA: Diagnosis not present

## 2021-08-25 DIAGNOSIS — E6609 Other obesity due to excess calories: Secondary | ICD-10-CM | POA: Diagnosis not present

## 2021-08-25 DIAGNOSIS — Z6831 Body mass index (BMI) 31.0-31.9, adult: Secondary | ICD-10-CM | POA: Diagnosis not present

## 2021-08-25 DIAGNOSIS — F419 Anxiety disorder, unspecified: Secondary | ICD-10-CM | POA: Diagnosis not present

## 2021-08-25 DIAGNOSIS — M720 Palmar fascial fibromatosis [Dupuytren]: Secondary | ICD-10-CM | POA: Diagnosis not present

## 2021-08-28 DIAGNOSIS — Z1211 Encounter for screening for malignant neoplasm of colon: Secondary | ICD-10-CM | POA: Diagnosis not present

## 2021-10-20 DIAGNOSIS — E669 Obesity, unspecified: Secondary | ICD-10-CM | POA: Diagnosis not present

## 2021-10-20 DIAGNOSIS — N401 Enlarged prostate with lower urinary tract symptoms: Secondary | ICD-10-CM | POA: Diagnosis not present

## 2021-10-20 DIAGNOSIS — E538 Deficiency of other specified B group vitamins: Secondary | ICD-10-CM | POA: Diagnosis not present

## 2021-10-20 DIAGNOSIS — Z6830 Body mass index (BMI) 30.0-30.9, adult: Secondary | ICD-10-CM | POA: Diagnosis not present

## 2021-10-20 DIAGNOSIS — E559 Vitamin D deficiency, unspecified: Secondary | ICD-10-CM | POA: Diagnosis not present

## 2021-10-20 DIAGNOSIS — I10 Essential (primary) hypertension: Secondary | ICD-10-CM | POA: Diagnosis not present

## 2021-10-20 DIAGNOSIS — Z0001 Encounter for general adult medical examination with abnormal findings: Secondary | ICD-10-CM | POA: Diagnosis not present

## 2021-10-20 DIAGNOSIS — R609 Edema, unspecified: Secondary | ICD-10-CM | POA: Diagnosis not present

## 2021-10-20 DIAGNOSIS — G5620 Lesion of ulnar nerve, unspecified upper limb: Secondary | ICD-10-CM | POA: Diagnosis not present

## 2021-10-20 DIAGNOSIS — E782 Mixed hyperlipidemia: Secondary | ICD-10-CM | POA: Diagnosis not present

## 2022-04-21 DIAGNOSIS — R3129 Other microscopic hematuria: Secondary | ICD-10-CM | POA: Diagnosis not present

## 2022-04-21 DIAGNOSIS — E782 Mixed hyperlipidemia: Secondary | ICD-10-CM | POA: Diagnosis not present

## 2022-04-21 DIAGNOSIS — F419 Anxiety disorder, unspecified: Secondary | ICD-10-CM | POA: Diagnosis not present

## 2022-04-21 DIAGNOSIS — N401 Enlarged prostate with lower urinary tract symptoms: Secondary | ICD-10-CM | POA: Diagnosis not present

## 2022-04-21 DIAGNOSIS — Z125 Encounter for screening for malignant neoplasm of prostate: Secondary | ICD-10-CM | POA: Diagnosis not present

## 2022-04-21 DIAGNOSIS — E538 Deficiency of other specified B group vitamins: Secondary | ICD-10-CM | POA: Diagnosis not present

## 2022-04-21 DIAGNOSIS — E663 Overweight: Secondary | ICD-10-CM | POA: Diagnosis not present

## 2022-04-21 DIAGNOSIS — Z0001 Encounter for general adult medical examination with abnormal findings: Secondary | ICD-10-CM | POA: Diagnosis not present

## 2022-04-21 DIAGNOSIS — Z6829 Body mass index (BMI) 29.0-29.9, adult: Secondary | ICD-10-CM | POA: Diagnosis not present

## 2022-04-21 DIAGNOSIS — M720 Palmar fascial fibromatosis [Dupuytren]: Secondary | ICD-10-CM | POA: Diagnosis not present

## 2022-04-21 DIAGNOSIS — I10 Essential (primary) hypertension: Secondary | ICD-10-CM | POA: Diagnosis not present

## 2022-04-21 DIAGNOSIS — K219 Gastro-esophageal reflux disease without esophagitis: Secondary | ICD-10-CM | POA: Diagnosis not present

## 2022-04-21 DIAGNOSIS — E559 Vitamin D deficiency, unspecified: Secondary | ICD-10-CM | POA: Diagnosis not present

## 2022-05-10 DIAGNOSIS — K219 Gastro-esophageal reflux disease without esophagitis: Secondary | ICD-10-CM | POA: Diagnosis not present

## 2022-05-10 DIAGNOSIS — Z6829 Body mass index (BMI) 29.0-29.9, adult: Secondary | ICD-10-CM | POA: Diagnosis not present

## 2022-05-11 ENCOUNTER — Encounter (INDEPENDENT_AMBULATORY_CARE_PROVIDER_SITE_OTHER): Payer: Self-pay | Admitting: *Deleted

## 2022-07-31 ENCOUNTER — Ambulatory Visit (INDEPENDENT_AMBULATORY_CARE_PROVIDER_SITE_OTHER): Payer: Medicare HMO | Admitting: Gastroenterology

## 2022-09-13 DIAGNOSIS — R3129 Other microscopic hematuria: Secondary | ICD-10-CM | POA: Diagnosis not present

## 2022-09-13 DIAGNOSIS — Z6829 Body mass index (BMI) 29.0-29.9, adult: Secondary | ICD-10-CM | POA: Diagnosis not present

## 2022-09-13 DIAGNOSIS — M720 Palmar fascial fibromatosis [Dupuytren]: Secondary | ICD-10-CM | POA: Diagnosis not present

## 2022-09-13 DIAGNOSIS — F419 Anxiety disorder, unspecified: Secondary | ICD-10-CM | POA: Diagnosis not present

## 2022-09-13 DIAGNOSIS — E663 Overweight: Secondary | ICD-10-CM | POA: Diagnosis not present

## 2022-09-13 DIAGNOSIS — N401 Enlarged prostate with lower urinary tract symptoms: Secondary | ICD-10-CM | POA: Diagnosis not present

## 2022-09-13 DIAGNOSIS — K219 Gastro-esophageal reflux disease without esophagitis: Secondary | ICD-10-CM | POA: Diagnosis not present

## 2022-09-13 DIAGNOSIS — I10 Essential (primary) hypertension: Secondary | ICD-10-CM | POA: Diagnosis not present

## 2022-09-14 ENCOUNTER — Encounter: Payer: Self-pay | Admitting: Internal Medicine

## 2022-10-18 DIAGNOSIS — R300 Dysuria: Secondary | ICD-10-CM | POA: Diagnosis not present

## 2022-10-27 ENCOUNTER — Encounter: Payer: Self-pay | Admitting: Gastroenterology

## 2022-10-30 ENCOUNTER — Encounter: Payer: Self-pay | Admitting: *Deleted

## 2022-10-30 ENCOUNTER — Ambulatory Visit (INDEPENDENT_AMBULATORY_CARE_PROVIDER_SITE_OTHER): Payer: Medicare HMO | Admitting: Gastroenterology

## 2022-10-30 ENCOUNTER — Encounter: Payer: Self-pay | Admitting: Gastroenterology

## 2022-10-30 ENCOUNTER — Other Ambulatory Visit: Payer: Self-pay | Admitting: *Deleted

## 2022-10-30 VITALS — BP 134/71 | HR 56 | Temp 97.7°F | Ht 67.0 in | Wt 198.2 lb

## 2022-10-30 DIAGNOSIS — R195 Other fecal abnormalities: Secondary | ICD-10-CM | POA: Insufficient documentation

## 2022-10-30 MED ORDER — PEG 3350-KCL-NA BICARB-NACL 420 G PO SOLR: 4000.0000 mL | Freq: Once | ORAL | 0 refills | Status: AC

## 2022-10-30 NOTE — Patient Instructions (Signed)
Colonoscopy to be scheduled. See separate instructions.   It was a pleasure to see you today. I want to create trusting relationships with patients and provide genuine, compassionate, and quality care. I truly value your feedback, so please be on the lookout for a survey regarding your visit with me today. I appreciate your time in completing this!

## 2022-10-30 NOTE — Progress Notes (Signed)
GI Office Note    Referring Provider: Redmond School, MD Primary Care Physician:  Redmond School, MD  Primary Gastroenterologist: Elon Alas. Abbey Chatters, DO   Chief Complaint   Chief Complaint  Patient presents with   Colonoscopy    Positive cologuard, last colonoscopy done at [facility] over 15 years ago.     History of Present Illness   Hunter Cuevas is a 72 y.o. male presenting today at the request of Dr. Gerarda Fraction for further evaluation of positive cologuard.   Patient reports his last colonoscopy was over 15 years ago, states he was awaking it was painful therefore he is completely off.  Recently completed Cologuard which was positive.  Bowel movements are regular.  No blood in the stool or melena.  He has some vague lower abdominal pain at times prior to BM, but resolves after bowel movement.  No heartburn, dysphagia, vomiting.  History of remote bleeding ulcer in his teenage years, diagnosed with barium study.  At times he has flare of symptoms.  He has omeprazole on hand for upper GI symptoms which he takes only as needed, describes this is rare.  H. pylori breath test was negative in 2020.  Denies NSAID use.  Labs from July 2023: Glucose 97, creatinine 0.88, albumin 4.6, total bilirubin 0.4, alkaline phosphatase 80, AST 24, ALT 22, white blood cell count 8800, hemoglobin 15.8, MCV 92, platelets 267,000, B12 490, folate 18.7    Medications   Current Outpatient Medications  Medication Sig Dispense Refill   ALPRAZolam (XANAX) 1 MG tablet 1 tablet 4 (four) times daily as needed.     cephALEXin (KEFLEX) 500 MG capsule Take 500 mg by mouth 4 (four) times daily.     losartan (COZAAR) 50 MG tablet 1 tablet daily.     lovastatin (MEVACOR) 40 MG tablet 1 tablet every evening.     mirtazapine (REMERON) 15 MG tablet 1 tablet at bedtime.     omeprazole (PRILOSEC) 20 MG capsule Take 1 capsule (20 mg total) by mouth daily. 30 capsule 1   polyethylene glycol-electrolytes (NULYTELY)  420 g solution Take 4,000 mLs by mouth once for 1 dose. 4000 mL 0   valACYclovir (VALTREX) 500 MG tablet 1 tablet daily.     No current facility-administered medications for this visit.    Allergies   Allergies as of 10/30/2022 - Review Complete 10/30/2022  Allergen Reaction Noted   Penicillins Hives 08/07/2016    Past Medical History   Past Medical History:  Diagnosis Date   BPH (benign prostatic hyperplasia)    GERD (gastroesophageal reflux disease)    Hemorrhoids    HTN (hypertension)    Hyperlipidemia, mild     Past Surgical History   Past Surgical History:  Procedure Laterality Date   CIRCUMCISION     HAND SURGERY     R and L   HEMORRHOID SURGERY      Past Family History   Family History  Problem Relation Age of Onset   Colon cancer Neg Hx     Past Social History   Social History   Socioeconomic History   Marital status: Married    Spouse name: Not on file   Number of children: Not on file   Years of education: Not on file   Highest education level: Not on file  Occupational History   Not on file  Tobacco Use   Smoking status: Former   Smokeless tobacco: Never  Substance and Sexual Activity   Alcohol  use: No   Drug use: No   Sexual activity: Yes  Other Topics Concern   Not on file  Social History Narrative   Not on file   Social Determinants of Health   Financial Resource Strain: Not on file  Food Insecurity: Not on file  Transportation Needs: Not on file  Physical Activity: Not on file  Stress: Not on file  Social Connections: Not on file  Intimate Partner Violence: Not on file    Review of Systems   General: Negative for anorexia, weight loss, fever, chills, fatigue, weakness. Eyes: Negative for vision changes.  ENT: Negative for hoarseness, difficulty swallowing , nasal congestion. CV: Negative for chest pain, angina, palpitations, dyspnea on exertion, peripheral edema.  Respiratory: Negative for dyspnea at rest, dyspnea on  exertion, cough, sputum, wheezing.  GI: See history of present illness. GU:  Negative for dysuria, hematuria, urinary incontinence, urinary frequency, nocturnal urination.  MS: Negative for joint pain, low back pain.  Derm: Negative for rash or itching.  Neuro: Negative for weakness, abnormal sensation, seizure, frequent headaches, memory loss,  confusion.  Psych: Negative for anxiety, depression, suicidal ideation, hallucinations.  Endo: Negative for unusual weight change.  Heme: Negative for bruising or bleeding. Allergy: Negative for rash or hives.  Physical Exam   BP 134/71 (BP Location: Right Arm, Patient Position: Sitting, Cuff Size: Large)   Pulse (!) 56   Temp 97.7 F (36.5 C) (Oral)   Ht '5\' 7"'$  (1.702 m)   Wt 198 lb 3.2 oz (89.9 kg)   SpO2 93%   BMI 31.04 kg/m    General: Well-nourished, well-developed in no acute distress.  Head: Normocephalic, atraumatic.   Eyes: Conjunctiva pink, no icterus. Mouth: Oropharyngeal mucosa moist and pink   Neck: Supple without thyromegaly, masses, or lymphadenopathy.  Lungs: Clear to auscultation bilaterally.  Heart: Regular rate and rhythm, no murmurs rubs or gallops.  Abdomen: Bowel sounds are normal, nontender, nondistended, no hepatosplenomegaly or masses,  no abdominal bruits or hernia, no rebound or guarding.   Rectal: not performed Extremities: No lower extremity edema. No clubbing or deformities.  Neuro: Alert and oriented x 4 , grossly normal neurologically.  Skin: Warm and dry, no rash or jaundice.   Psych: Alert and cooperative, normal mood and affect.  Labs   See hpi  Imaging Studies   No results found.  Assessment   Positive cologuard: remote colonoscopy about 15 years ago. Denies GI symptoms. Needs colonoscopy for further evaluation.    PLAN   Colonoscopy in near future. ASA 2.  I have discussed the risks, alternatives, benefits with regards to but not limited to the risk of reaction to medication, bleeding,  infection, perforation and the patient is agreeable to proceed. Written consent to be obtained.    Laureen Ochs. Bobby Rumpf, [place], [facility]

## 2022-10-31 ENCOUNTER — Other Ambulatory Visit (INDEPENDENT_AMBULATORY_CARE_PROVIDER_SITE_OTHER): Payer: Self-pay | Admitting: *Deleted

## 2022-10-31 ENCOUNTER — Encounter: Payer: Self-pay | Admitting: Gastroenterology

## 2022-10-31 MED ORDER — PEG 3350-KCL-NA BICARB-NACL 420 G PO SOLR: 4000.0000 mL | Freq: Once | ORAL | 0 refills | Status: AC

## 2022-11-01 ENCOUNTER — Telehealth: Payer: Self-pay | Admitting: *Deleted

## 2022-11-01 NOTE — Telephone Encounter (Signed)
[employer] PA: Approved Authorization #[id number]  Tracking #[id number]  DOS:2/823/24-5/23/24

## 2022-11-13 ENCOUNTER — Telehealth: Payer: Self-pay | Admitting: *Deleted

## 2022-11-13 ENCOUNTER — Encounter: Payer: Self-pay | Admitting: *Deleted

## 2022-11-13 NOTE — Telephone Encounter (Signed)
Pt called to reschedule his procedure on 11/17/22. He states he is sick. He wants to wait until March. Pt has been rescheduled until 12/05/22 . New instructions have been mailed to pt.

## 2022-12-05 ENCOUNTER — Ambulatory Visit (HOSPITAL_COMMUNITY)
Admission: RE | Admit: 2022-12-05 | Discharge: 2022-12-05 | Disposition: A | Discharge status: Home / Self Care | Payer: Medicare HMO | Attending: Internal Medicine | Admitting: Internal Medicine

## 2022-12-05 ENCOUNTER — Ambulatory Visit (HOSPITAL_BASED_OUTPATIENT_CLINIC_OR_DEPARTMENT_OTHER): Payer: Medicare HMO | Admitting: Anesthesiology

## 2022-12-05 ENCOUNTER — Ambulatory Visit (HOSPITAL_COMMUNITY): Payer: Medicare HMO | Admitting: Anesthesiology

## 2022-12-05 ENCOUNTER — Encounter (HOSPITAL_COMMUNITY): Payer: Self-pay

## 2022-12-05 ENCOUNTER — Other Ambulatory Visit: Payer: Self-pay

## 2022-12-05 ENCOUNTER — Encounter (HOSPITAL_COMMUNITY)
Admission: RE | Disposition: A | Discharge status: Home / Self Care | Payer: Self-pay | Source: Home / Self Care | Attending: Internal Medicine

## 2022-12-05 DIAGNOSIS — R195 Other fecal abnormalities: Secondary | ICD-10-CM | POA: Diagnosis not present

## 2022-12-05 DIAGNOSIS — K635 Polyp of colon: Secondary | ICD-10-CM | POA: Insufficient documentation

## 2022-12-05 DIAGNOSIS — Z1211 Encounter for screening for malignant neoplasm of colon: Secondary | ICD-10-CM | POA: Diagnosis not present

## 2022-12-05 DIAGNOSIS — D120 Benign neoplasm of cecum: Secondary | ICD-10-CM | POA: Diagnosis not present

## 2022-12-05 DIAGNOSIS — I10 Essential (primary) hypertension: Secondary | ICD-10-CM | POA: Diagnosis not present

## 2022-12-05 DIAGNOSIS — K648 Other hemorrhoids: Secondary | ICD-10-CM | POA: Diagnosis not present

## 2022-12-05 DIAGNOSIS — D122 Benign neoplasm of ascending colon: Secondary | ICD-10-CM | POA: Insufficient documentation

## 2022-12-05 DIAGNOSIS — K219 Gastro-esophageal reflux disease without esophagitis: Secondary | ICD-10-CM | POA: Diagnosis not present

## 2022-12-05 DIAGNOSIS — K5730 Diverticulosis of large intestine without perforation or abscess without bleeding: Secondary | ICD-10-CM | POA: Diagnosis not present

## 2022-12-05 DIAGNOSIS — Z87891 Personal history of nicotine dependence: Secondary | ICD-10-CM | POA: Diagnosis not present

## 2022-12-05 HISTORY — PX: HEMOSTASIS CLIP PLACEMENT: SHX6857

## 2022-12-05 HISTORY — PX: POLYPECTOMY: SHX5525

## 2022-12-05 HISTORY — PX: COLONOSCOPY WITH PROPOFOL: SHX5780

## 2022-12-05 HISTORY — PX: SUBMUCOSAL LIFTING INJECTION: SHX6855

## 2022-12-05 SURGERY — COLONOSCOPY WITH PROPOFOL
Anesthesia: General

## 2022-12-05 MED ORDER — STERILE WATER FOR IRRIGATION IR SOLN
Status: DC | PRN
  Administered 2022-12-05: 100 mL

## 2022-12-05 MED ORDER — PROPOFOL 500 MG/50ML IV EMUL
INTRAVENOUS | Status: DC | PRN
  Administered 2022-12-05: 125 ug/kg/min via INTRAVENOUS

## 2022-12-05 MED ORDER — LACTATED RINGERS IV SOLN: INTRAVENOUS | Status: DC

## 2022-12-05 MED ORDER — PROPOFOL 10 MG/ML IV BOLUS
INTRAVENOUS | Status: DC | PRN
  Administered 2022-12-05: 30 mg via INTRAVENOUS
  Administered 2022-12-05: 120 mg via INTRAVENOUS

## 2022-12-05 NOTE — Anesthesia Preprocedure Evaluation (Signed)
Anesthesia Evaluation  Patient identified by MRN, date of birth, ID band Patient awake    Reviewed: Allergy & Precautions, H&P , NPO status , Patient's Chart, lab work & pertinent test results, reviewed documented beta blocker date and time   Airway Mallampati: II  TM Distance: >3 FB Neck ROM: full    Dental no notable dental hx.    Pulmonary neg pulmonary ROS, former smoker   Pulmonary exam normal breath sounds clear to auscultation       Cardiovascular Exercise Tolerance: Good hypertension, negative cardio ROS  Rhythm:regular Rate:Normal     Neuro/Psych negative neurological ROS  negative psych ROS   GI/Hepatic negative GI ROS, Neg liver ROS,GERD  ,,  Endo/Other  negative endocrine ROS    Renal/GU negative Renal ROS  negative genitourinary   Musculoskeletal   Abdominal   Peds  Hematology negative hematology ROS (+)   Anesthesia Other Findings   Reproductive/Obstetrics negative OB ROS                             Anesthesia Physical Anesthesia Plan  ASA: 2  Anesthesia Plan: General   Post-op Pain Management:    Induction:   PONV Risk Score and Plan: Propofol infusion  Airway Management Planned:   Additional Equipment:   Intra-op Plan:   Post-operative Plan:   Informed Consent: I have reviewed the patients History and Physical, chart, labs and discussed the procedure including the risks, benefits and alternatives for the proposed anesthesia with the patient or authorized representative who has indicated his/her understanding and acceptance.     Dental Advisory Given  Plan Discussed with: CRNA  Anesthesia Plan Comments:        Anesthesia Quick Evaluation  

## 2022-12-05 NOTE — Transfer of Care (Signed)
Immediate Anesthesia Transfer of Care Note  Patient: Hunter Cuevas  Procedure(s) Performed: COLONOSCOPY WITH PROPOFOL SUBMUCOSAL LIFTING INJECTION POLYPECTOMY HEMOSTASIS CLIP PLACEMENT  Patient Location: Endoscopy Unit  Anesthesia Type:General  Level of Consciousness: awake and patient cooperative  Airway & Oxygen Therapy: Patient Spontanous Breathing  Post-op Assessment: Report given to RN and Post -op Vital signs reviewed and stable  Post vital signs: Reviewed and stable  Last Vitals:  Vitals Value Taken Time  BP 106/47 12/05/22 0907  Temp 36.4 C 12/05/22 0907  Pulse 54 12/05/22 0907  Resp 18 12/05/22 0907  SpO2 96 % 12/05/22 0907    Last Pain:  Vitals:   12/05/22 0907  TempSrc: Oral  PainSc: 0-No pain      Patients Stated Pain Goal: 8 ([id number])  Complications: No notable events documented.

## 2022-12-05 NOTE — H&P (Signed)
Primary Care Physician:  Redmond School, MD Primary Gastroenterologist:  Dr. Abbey Chatters  Pre-Procedure History & Physical: HPI:  Hunter Cuevas is a 72 y.o. male is here for a colonoscopy to be performed for positive cologuard testing.   Past Medical History:  Diagnosis Date   BPH (benign prostatic hyperplasia)    GERD (gastroesophageal reflux disease)    Hemorrhoids    HTN (hypertension)    Hyperlipidemia, mild     Past Surgical History:  Procedure Laterality Date   CIRCUMCISION     HAND SURGERY     R and L   HEMORRHOID SURGERY     UMBILICAL HERNIA REPAIR      Prior to Admission medications   Medication Sig Start Date End Date Taking? Authorizing Provider  ALPRAZolam Duanne Moron) 1 MG tablet Take 1 mg by mouth 3 (three) times daily as needed (anxiety with crowds). 07/31/16  Yes [provider]  ascorbic acid (VITAMIN C) 500 MG tablet Take 500 mg by mouth in the morning.   Yes [provider]  aspirin EC 81 MG tablet Take 81 mg by mouth in the morning. Swallow whole.   Yes [provider]  Cholecalciferol (VITAMIN D3) 125 MCG (5000 UT) TABS Take 5,000 Units by mouth in the morning.   Yes [provider]  Coenzyme Q10 (COQ10) 100 MG CAPS Take 100 mg by mouth in the morning.   Yes [provider]  L-Lysine 500 MG TABS Take 500 mg by mouth in the morning.   Yes [provider]  losartan (COZAAR) 50 MG tablet Take 50 mg by mouth at bedtime. 07/31/16  Yes [provider]  lovastatin (MEVACOR) 40 MG tablet Take 40 mg by mouth at bedtime. 07/31/16  Yes [provider]  Omega-3 Fatty Acids (FISH OIL PO) Take 2,000 mg by mouth in the morning.   Yes [provider]  polyethylene glycol-electrolytes (NULYTELY) 420 g solution Take 4,000 mLs by mouth once. 10/31/22  Yes [provider]  Potassium 99 MG TABS Take 99 mg by mouth in the morning.   Yes [provider]  valACYclovir (VALTREX) 500 MG tablet Take  500 mg by mouth at bedtime. 05/04/16  Yes [provider]  pantoprazole (PROTONIX) 40 MG tablet Take 40 mg by mouth daily as needed (ulcer upset). 11/20/22   [provider]    Allergies as of 10/30/2022 - Review Complete 10/30/2022  Allergen Reaction Noted   Penicillins Hives 08/07/2016    Family History  Problem Relation Age of Onset   Colon cancer Neg Hx     Social History   Socioeconomic History   Marital status: Married    Spouse name: Not on file   Number of children: Not on file   Years of education: Not on file   Highest education level: Not on file  Occupational History   Not on file  Tobacco Use   Smoking status: Former   Smokeless tobacco: Never  Substance and Sexual Activity   Alcohol use: No   Drug use: No   Sexual activity: Yes  Other Topics Concern   Not on file  Social History Narrative   Not on file   Social Determinants of Health   Financial Resource Strain: Not on file  Food Insecurity: Not on file  Transportation Needs: Not on file  Physical Activity: Not on file  Stress: Not on file  Social Connections: Not on file  Intimate Partner Violence: Not on file  Review of Systems: See HPI, otherwise negative ROS  Physical Exam: Vital signs in last 24 hours: Temp:  [98.2 F (36.8 C)] 98.2 F (36.8 C) (03/12 0808) Pulse Rate:  [68] 68 (03/12 0808) Resp:  [15] 15 (03/12 0808) BP: (147)/(71) 147/71 (03/12 0808) SpO2:  [96 %] 96 % (03/12 0808) Weight:  [87.1 kg] 87.1 kg (03/12 0808)   General:   Alert,  Well-developed, well-nourished, pleasant and cooperative in NAD Head:  Normocephalic and atraumatic. Eyes:  Sclera clear, no icterus.   Conjunctiva pink. Ears:  Normal auditory acuity. Nose:  No deformity, discharge,  or lesions. Msk:  Symmetrical without gross deformities. Normal posture. Extremities:  Without clubbing or edema. Neurologic:  Alert and  oriented x4;  grossly normal neurologically. Skin:  Intact without  significant lesions or rashes. Psych:  Alert and cooperative. Normal mood and affect.  Impression/Plan: [name] is here for a colonoscopy to be performed for positive cologuard testing.   The risks of the procedure including infection, bleed, or perforation as well as benefits, limitations, alternatives and imponderables have been reviewed with the patient. Questions have been answered. All parties agreeable.

## 2022-12-05 NOTE — Anesthesia Postprocedure Evaluation (Signed)
Anesthesia Post Note  Patient: Hunter Cuevas  Procedure(s) Performed: COLONOSCOPY WITH PROPOFOL SUBMUCOSAL LIFTING INJECTION POLYPECTOMY HEMOSTASIS CLIP PLACEMENT  Patient location during evaluation: Phase II Anesthesia Type: General Level of consciousness: awake Pain management: pain level controlled Vital Signs Assessment: post-procedure vital signs reviewed and stable Respiratory status: spontaneous breathing and respiratory function stable Cardiovascular status: blood pressure returned to baseline and stable Postop Assessment: no headache and no apparent nausea or vomiting Anesthetic complications: no Comments: Late entry   No notable events documented.   Last Vitals:  Vitals:   12/05/22 0907 12/05/22 0912  BP: (!) 106/47 (!) 118/48  Pulse: (!) 54 (!) 57  Resp: 18 18  Temp: 36.4 C   SpO2: 96% 96%    Last Pain:  Vitals:   12/05/22 0912  TempSrc:   PainSc: 0-No pain                 Louann Sjogren

## 2022-12-05 NOTE — Op Note (Addendum)
[facility] Patient Name: Hunter Cuevas Procedure Date: 12/05/2022 8:09 AM MRN: [id number] Date of Birth: 12/16/1950 Attending MD: Elon Alas. Abbey Chatters , [place], [id number] CSN: [id number] Age: 72 Admit Type: Outpatient Procedure:                Colonoscopy Indications:              Positive Cologuard test Providers:                Elon Alas. Abbey Chatters, DO, Charlsie Quest. Theda Sers RN, RN,                            Randa Spike, Technician Referring MD:              Medicines:                See the Anesthesia note for documentation of the                            administered medications Complications:            No immediate complications. Estimated Blood Loss:     Estimated blood loss was minimal. Procedure:                Pre-Anesthesia Assessment:                           - The anesthesia plan was to use monitored                            anesthesia care (MAC).                           After obtaining informed consent, the colonoscope                            was passed under direct vision. Throughout the                            procedure, the patient's blood pressure, pulse, and                            oxygen saturations were monitored continuously. The                            [id number]) was introduced through the                            anus and advanced to the the cecum, identified by                            appendiceal orifice and ileocecal valve. The                            colonoscopy was performed without difficulty. The                            patient tolerated the procedure well. The  quality                            of the bowel preparation was evaluated using the                            BBPS [facility] Bowel Preparation Scale) with scores                            of: Right Colon = 2 (minor amount of residual                            staining, small fragments of stool and/or opaque                            liquid, but mucosa seen  well), Transverse Colon = 2                            (minor amount of residual staining, small fragments                            of stool and/or opaque liquid, but mucosa seen                            well) and Left Colon = 3 (entire mucosa seen well                            with no residual staining, small fragments of stool                            or opaque liquid). The total BBPS score equals 7.                            The quality of the bowel preparation was good. Scope In: 8:29:42 AM Scope Out: 9:03:20 AM Scope Withdrawal Time: 0 hours 30 minutes 0 seconds  Total Procedure Duration: 0 hours 33 minutes 38 seconds  Findings:      Non-bleeding internal hemorrhoids were found during endoscopy.      Multiple large-mouthed and small-mouthed diverticula were found in the       sigmoid colon, transverse colon and ascending colon.      Two sessile polyps were found in the ascending colon. The polyps were 4       to 6 mm in size. These polyps were removed with a cold snare. Resection       and retrieval were complete.      A 20 mm polyp was found in the cecum. The polyp was flat. Preparations       were made for mucosal resection. Demarcation of the lesion was performed       during the procedure to clearly identify the boundaries of the lesion.       Eleview was injected with adequate lift of the lesion from the       muscularis propria. Snare mucosal resection was performed. Resection and       retrieval were complete. Resected  tissue margins were examined and clear       of polyp tissue. There was no bleeding at the end of the procedure. To       close a defect after mucosal resection, three hemostatic clips were       successfully placed (MR conditional). Clip manufacturer: [employer]. There was no bleeding at the end of the procedure.      The exam was otherwise without abnormality. Impression:               - Non-bleeding internal hemorrhoids.                            - Diverticulosis in the sigmoid colon, in the                            transverse colon and in the ascending colon.                           - Two 4 to 6 mm polyps in the ascending colon,                            removed with a cold snare. Resected and retrieved.                           - One 20 mm polyp in the cecum, removed with                            mucosal resection. Resected and retrieved. Clips                            (MR conditional) were placed. Clip manufacturer:                            [employer].                           - The examination was otherwise normal.                           - Mucosal resection was performed. Resection and                            retrieval were complete. Moderate Sedation:      Per Anesthesia Care Recommendation:           - Patient has a contact number available for                            emergencies. The signs and symptoms of potential                            delayed complications were discussed with the                            patient. Return to normal activities tomorrow.  Written discharge instructions were provided to the                            patient.                           - Resume previous diet.                           - Continue present medications.                           - Await pathology results.                           - Repeat colonoscopy in 6 months for surveillance.                           - Return to GI clinic PRN.                           - PATIENT WILL NEED KUB PRIOR TO ANY MRI DONE IN                            THE NEAR FUTURE DUE TO CLIPS PLACED TODAY Procedure Code(s):        --- Professional ---                           [phone], Colonoscopy, flexible; with endoscopic                            mucosal resection                           [id number], 81, Colonoscopy, flexible; with removal of                            tumor(s), polyp(s), or  other lesion(s) by snare                            technique Diagnosis Code(s):        --- Professional ---                           K64.8, Other hemorrhoids                           D12.2, Benign neoplasm of ascending colon                           D12.0, Benign neoplasm of cecum                           R19.5, Other fecal abnormalities                           K57.30, Diverticulosis of large intestine without  perforation or abscess without bleeding CPT copyright 2022 American Medical Association. All rights reserved. The codes documented in this report are preliminary and upon coder review may  be revised to meet current compliance requirements. Elon Alas. Abbey Chatters, DO [place] Abbey Chatters, DO 12/05/2022 9:09:40 AM This report has been signed electronically. Number of Addenda: 0

## 2022-12-05 NOTE — Discharge Instructions (Addendum)
  Colonoscopy Discharge Instructions  Read the instructions outlined below and refer to this sheet in the next few weeks. These discharge instructions provide you with general information on caring for yourself after you leave the hospital. Your doctor may also give you specific instructions. While your treatment has been planned according to the most current medical practices available, unavoidable complications occasionally occur.   ACTIVITY You may resume your regular activity, but move at a slower pace for the next 24 hours.  Take frequent rest periods for the next 24 hours.  Walking will help get rid of the air and reduce the bloated feeling in your belly (abdomen).  No driving for 24 hours (because of the medicine (anesthesia) used during the test).   Do not sign any important legal documents or operate any machinery for 24 hours (because of the anesthesia used during the test).  NUTRITION Drink plenty of fluids.  You may resume your normal diet as instructed by your doctor.  Begin with a light meal and progress to your normal diet. Heavy or fried foods are harder to digest and may make you feel sick to your stomach (nauseated).  Avoid alcoholic beverages for 24 hours or as instructed.  MEDICATIONS You may resume your normal medications unless your doctor tells you otherwise.  WHAT YOU CAN EXPECT TODAY Some feelings of bloating in the abdomen.  Passage of more gas than usual.  Spotting of blood in your stool or on the toilet paper.  IF YOU HAD POLYPS REMOVED DURING THE COLONOSCOPY: No aspirin products for 7 days or as instructed.  No alcohol for 7 days or as instructed.  Eat a soft diet for the next 24 hours.  FINDING OUT THE RESULTS OF YOUR TEST Not all test results are available during your visit. If your test results are not back during the visit, make an appointment with your caregiver to find out the results. Do not assume everything is normal if you have not heard from your  caregiver or the medical facility. It is important for you to follow up on all of your test results.  SEEK IMMEDIATE MEDICAL ATTENTION IF: You have more than a spotting of blood in your stool.  Your belly is swollen (abdominal distention).  You are nauseated or vomiting.  You have a temperature over 101.  You have abdominal pain or discomfort that is severe or gets worse throughout the day.   Your colonoscopy revealed 3 polyp(s) which I removed successfully. 1 was quite large requiring in depth resection. Await pathology results, my office will contact you. I recommend repeating colonoscopy in 6 months for surveillance purposes.   I placed 3 metallic clips to close the defect of large polyp after resection. If you need an MRI for any reason in the near future, you will need to have XRAY prior.   You also have diverticulosis and internal hemorrhoids. I would recommend increasing fiber in your diet or adding OTC Benefiber/Metamucil. Be sure to drink at least 4 to 6 glasses of water daily. Follow-up with GI as needed.    I hope you have a great rest of your week!  Elon Alas. Abbey Chatters, D.O. Gastroenterology and Hepatology [facility]

## 2022-12-05 NOTE — Anesthesia Procedure Notes (Signed)
Date/Time: 12/05/2022 8:24 AM  Performed by: Vista Deck, CRNAPre-anesthesia Checklist: Patient identified, Emergency Drugs available, Suction available, Timeout performed and Patient being monitored Patient Re-evaluated:Patient Re-evaluated prior to induction Oxygen Delivery Method: Nasal Cannula

## 2022-12-08 LAB — SURGICAL PATHOLOGY

## 2022-12-14 ENCOUNTER — Encounter (HOSPITAL_COMMUNITY): Payer: Self-pay | Admitting: Internal Medicine

## 2023-01-16 DIAGNOSIS — E782 Mixed hyperlipidemia: Secondary | ICD-10-CM | POA: Diagnosis not present

## 2023-01-16 DIAGNOSIS — F419 Anxiety disorder, unspecified: Secondary | ICD-10-CM | POA: Diagnosis not present

## 2023-01-16 DIAGNOSIS — E663 Overweight: Secondary | ICD-10-CM | POA: Diagnosis not present

## 2023-01-16 DIAGNOSIS — Z6828 Body mass index (BMI) 28.0-28.9, adult: Secondary | ICD-10-CM | POA: Diagnosis not present

## 2023-01-16 DIAGNOSIS — N401 Enlarged prostate with lower urinary tract symptoms: Secondary | ICD-10-CM | POA: Diagnosis not present

## 2023-01-16 DIAGNOSIS — A6922 Other neurologic disorders in Lyme disease: Secondary | ICD-10-CM | POA: Diagnosis not present

## 2023-01-16 DIAGNOSIS — D518 Other vitamin B12 deficiency anemias: Secondary | ICD-10-CM | POA: Diagnosis not present

## 2023-01-16 DIAGNOSIS — R300 Dysuria: Secondary | ICD-10-CM | POA: Diagnosis not present

## 2023-01-16 DIAGNOSIS — I10 Essential (primary) hypertension: Secondary | ICD-10-CM | POA: Diagnosis not present

## 2023-02-28 DIAGNOSIS — N138 Other obstructive and reflux uropathy: Secondary | ICD-10-CM | POA: Insufficient documentation

## 2023-02-28 NOTE — Progress Notes (Unsigned)
History of Present Illness: Mr. Hunter Cuevas is a 72 y.o. male who presents today as a new patient at [facility]. All available relevant medical records have been reviewed. He is accompanied by his wife. - GU History: 1. BPH with LUTS.  He reports increased urinary frequency, nocturia, urgency, post-void dribbling. Denies wearing pads or diapers. He reports occasional split urinary stream. Denies urge or stress incontinence. He reports urinating 9-10 times per day and 1-2 times at night (sleeps about 5 hours per night).  Denies dysuria, gross hematuria, straining to void, or sensations of incomplete emptying.  Caffeine intake: 1 mountain dew per day, no coffee or soda. Additional bladder irritants: Reports eating a lot of spicy food and significant alcohol intake (states he drinks a lot of beer - "up to 18 per day")  He saw his PCP Dr. Sherwood Gambler for urinary problems about 6 months ago; tried 1 or 2 medications (he doesn't recall which) - discontinued due to dizziness. Those records are unavailable for review today; will request.  He denies family history of prostate cancer.    Fall Screening: Do you usually have a device to assist in your mobility? No   Medications: Current Outpatient Medications  Medication Sig Dispense Refill   ALPRAZolam (XANAX) 1 MG tablet Take 1 mg by mouth 3 (three) times daily as needed (anxiety with crowds).     ascorbic acid (VITAMIN C) 500 MG tablet Take 500 mg by mouth in the morning.     aspirin EC 81 MG tablet Take 81 mg by mouth in the morning. Swallow whole.     Cholecalciferol (VITAMIN D3) 125 MCG (5000 UT) TABS Take 5,000 Units by mouth in the morning.     Coenzyme Q10 (COQ10) 100 MG CAPS Take 100 mg by mouth in the morning.     L-Lysine 500 MG TABS Take 500 mg by mouth in the morning.     losartan (COZAAR) 50 MG tablet Take 50 mg by mouth at bedtime.     lovastatin (MEVACOR) 40 MG tablet Take 40 mg by mouth at bedtime.     mirabegron  ER (MYRBETRIQ) 25 MG TB24 tablet Take 1 tablet (25 mg total) by mouth daily. 30 tablet 11   Omega-3 Fatty Acids (FISH OIL PO) Take 2,000 mg by mouth in the morning.     pantoprazole (PROTONIX) 40 MG tablet Take 40 mg by mouth daily as needed (ulcer upset).     Potassium 99 MG TABS Take 99 mg by mouth in the morning.     valACYclovir (VALTREX) 500 MG tablet Take 500 mg by mouth at bedtime.     No current facility-administered medications for this visit.    Allergies: Allergies  Allergen Reactions   Penicillins Hives    Has patient had a PCN reaction causing immediate rash, facial/tongue/throat swelling, SOB or lightheadedness with hypotension: No Has patient had a PCN reaction causing severe rash involving mucus membranes or skin necrosis: No Has patient had a PCN reaction that required hospitalization No Has patient had a PCN reaction occurring within the last 10 years: No If all of the above answers are "NO", then may proceed with Cephalosporin use.    Past Medical History:  Diagnosis Date   BPH (benign prostatic hyperplasia)    GERD (gastroesophageal reflux disease)    Hemorrhoids    HTN (hypertension)    Hyperlipidemia, mild    Past Surgical History:  Procedure Laterality Date   CIRCUMCISION  COLONOSCOPY WITH PROPOFOL N/A 12/05/2022   Procedure: COLONOSCOPY WITH PROPOFOL;  Surgeon: Lanelle Bal, DO;  Location: [facility];  Service: Endoscopy;  Laterality: N/A;  1:00 pm   HAND SURGERY     R and L   HEMORRHOID SURGERY     HEMOSTASIS CLIP PLACEMENT  12/05/2022   Procedure: HEMOSTASIS CLIP PLACEMENT;  Surgeon: Lanelle Bal, DO;  Location: [facility];  Service: Endoscopy;;   POLYPECTOMY  12/05/2022   Procedure: POLYPECTOMY;  Surgeon: Lanelle Bal, DO;  Location: [facility];  Service: Endoscopy;;   SUBMUCOSAL LIFTING INJECTION  12/05/2022   Procedure: SUBMUCOSAL LIFTING INJECTION;  Surgeon: Lanelle Bal, DO;  Location: [facility];  Service:  Endoscopy;;   UMBILICAL HERNIA REPAIR      Family History  Problem Relation Age of Onset   Colon cancer Neg Hx    Social History   Socioeconomic History   Marital status: Married    Spouse name: Not on file   Number of children: Not on file   Years of education: Not on file   Highest education level: Not on file  Occupational History   Not on file  Tobacco Use   Smoking status: Every Day    Packs/day: 0.50    Years: 10.00    Additional pack years: 0.00    Total pack years: 5.00    Types: Cigarettes   Smokeless tobacco: Never  Substance and Sexual Activity   Alcohol use: Yes    Alcohol/week: 3.0 standard drinks of alcohol    Types: 3 Cans of beer per week    Comment: About 3 a day   Drug use: No   Sexual activity: Yes  Other Topics Concern   Not on file  Social History Narrative   Not on file   Social Determinants of Health   Financial Resource Strain: Not on file  Food Insecurity: Not on file  Transportation Needs: Not on file  Physical Activity: Not on file  Stress: Not on file  Social Connections: Not on file  Intimate Partner Violence: Not on file    SUBJECTIVE  Review of Systems Constitutional: Patient denies any unintentional weight loss or change in strength lntegumentary: Patient denies any rashes or pruritus Eyes: Patient denies dry eyes ENT: Patient denies dry mouth Cardiovascular: Patient denies chest pain or syncope Respiratory: Patient denies shortness of breath Gastrointestinal: Patient reports stomach ulcer Musculoskeletal: Patient denies muscle cramps or weakness Neurologic: Patient denies convulsions or seizures Psychiatric: Patient denies memory problems Allergic/Immunologic: Patient denies recent allergic reaction(s) Hematologic/Lymphatic: Patient denies bleeding tendencies Endocrine: Patient denies heat/cold intolerance  GU: As per HPI.  OBJECTIVE Vitals:   03/01/23 0944  BP: 133/71  Pulse: 76  Temp: 97.9 F (36.6 C)    There is no height or weight on file to calculate BMI.  Physical Examination  Constitutional: No obvious distress; patient is non-toxic appearing  Cardiovascular: No visible lower extremity edema.  Respiratory: The patient does not have audible wheezing/stridor; respirations do not appear labored  Gastrointestinal: Abdomen non-distended Musculoskeletal: Normal ROM of UEs  Skin: No obvious rashes/open sores  Neurologic: CN 2-12 grossly intact Psychiatric: Answered questions appropriately with normal affect  Hematologic/Lymphatic/Immunologic: No obvious bruises or sites of spontaneous bleeding  UA: negative PVR: 106 ml  ASSESSMENT Urinary frequency - Plan: Urinalysis, Routine w reflex microscopic, BLADDER SCAN AMB NON-IMAGING, PR COMPLEX UROFLOMETRY, mirabegron ER (MYRBETRIQ) 25 MG TB24 tablet  BPH with obstruction/lower urinary tract symptoms - Plan: Urinalysis,  Routine w reflex microscopic, BLADDER SCAN AMB NON-IMAGING, PR COMPLEX UROFLOMETRY  Urinary urgency - Plan: mirabegron ER (MYRBETRIQ) 25 MG TB24 tablet  Post-void dribbling - Plan: Urinalysis, Routine w reflex microscopic, BLADDER SCAN AMB NON-IMAGING, PR COMPLEX UROFLOMETRY, mirabegron ER (MYRBETRIQ) 25 MG TB24 tablet  Alcohol consumption heavy - Plan: BLADDER SCAN AMB NON-IMAGING  We discussed history of BPH; it sounds like he may not tolerate alpha blockers well. Will request PCP records to confirm what exactly was prescribed there. Given that he is emptying adequately at this time we agreed to proceed with an overactive bladder (OAB) approach for now. We discussed contributing factors including routine consumption of known bladder irritants including caffeine, acidic foods, spicy foods, and excessive alcohol. He was advised to reduce those significantly and to modify his fluid intake. We agreed to trial Myrbetriq 25 mg daily also; prefer to avoid anticholinergic medications based on his age and risk for side effects.  Will  plan for follow up in 4 weeks after reviewing outside records or sooner if needed. Pt verbalized understanding and agreement. All questions were answered.  PLAN Advised the following: Requesting relevant PCP records for review. Start Myrbetriq 25 mg daily. Minimize alcohol intake. Minimize intake of spicy and acidic foods. Minimize caffeine intake. Return in about 4 weeks (around 03/29/2023) for UA, PVR, & f/u with Evette Georges NP.  Orders Placed This Encounter  Procedures   Urinalysis, Routine w reflex microscopic   PR COMPLEX UROFLOMETRY   BLADDER SCAN AMB NON-IMAGING    It has been explained that the patient is to follow regularly with their PCP in addition to all other providers involved in their care and to follow instructions provided by these respective offices. Patient advised to contact urology clinic if any urologic-pertaining questions, concerns, new symptoms or problems arise in the interim period.  Patient Instructions  Overactive bladder (OAB) overview for patients:  Symptoms may include: urinary urgency ("gotta go" feeling) urinary frequency (voiding >8 times per day) night time urination (nocturia) urge incontinence of urine (UUI)  While we do not know the exact etiology of OAB, several treatment options exist including:  Behavioral therapy: Reducing fluid intake Decreasing bladder stimulants (such as caffeine) and irritants (such as acidic food, spicy foods, alcohol) Urge suppression strategies Bladder retraining via timed voiding Pelvic floor physical therapy  Medication(s) - can use one or both of the drug classes below. Anticholinergic / antimuscarinic medications:  Mechanism of action: Activate M3 receptors to reduce detrusor stimulation and increase bladder capacity   (parasympathetic nervous system). Effect: Relaxes the bladder to decrease overactivity, increase bladder storage capacity, and increase time between voids. Onset: Slow acting (may take  8-12 weeks to determine efficacy). Medications include: Vesicare (Solifenacin), Ditropan (Oxybutynin), Detrol (Tolterodine), Toviaz (Fesoterodine), Sanctura (Trospium), Urispas (Flavoxate), Enablex (Darifenacin), Bentyl (Dicyclomine), Levsin (Hyoscyamine ). Potential side effects include but are not limited to: Dry eyes, dry mouth, constipation, cognitive impairment, dementia risk with long term use, and urinary retention/ incomplete bladder emptying. Insurance companies generally prefer for patients to try 1-2 anticholinergic / antimuscarinic medications first due to low cost. Some exceptions are made based on patient-specific comorbidities / risk factors. Beta-3 agonist medications: Mechanism of action: Stimulates selective B3 adrenergic receptors to cause smooth muscle bladder relaxation (sympathetic nervous system). Effect: Relaxes the bladder to decrease overactivity, increase bladder storage capacity, and increase time between voids. Onset: Slow acting (may take 8-12 weeks to determine efficacy). Medications include: Myrbetriq (Mirabegron) and Vibegron Leslye Peer). Potential side effects include but are not  limited to: urinary retention / incomplete bladder emptying and elevated blood pressure (more likely to occur in individuals with pre-existing uncontrolled hypertension). These medications tend to be more expensive than the anticholinergic / antimuscarinic medications.   For patients with refractory OAB (if the above treatment options have been unsuccessful): Posterior tibial nerve stimulation (PTNS). Small acupuncture-type needle inserted near ankle with electric current to stimulate bladder via posterior tibial nerve pathway. Initially requires 12 weekly in-office treatments lasting 30 minutes each; followed by monthly in-office treatments lasting 30 minutes each for 1 year.  Bladder Botox injections. How it is done: Typically done via in-office cystoscopy; sometimes done in the OR  depending on the situation. The bladder is numbed with lidocaine instilled via a catheter. Then the urologist injects Botox into the bladder muscle wall in about 20 locations. Causes local paralysis of the bladder muscle at the injection sites to reduce bladder muscle overactivity / spasms. The effect lasts for approximately 6 months and cannot be reversed once performed. Risks may included but are not limited to: infection, incomplete bladder emptying/ urinary retention, short term need for self-catheterization or indwelling catheter, and need for repeat therapy. There is a 5-12% chance of needing to catheterize with Botox - that usually resolves in a few months as the Botox wears off. Typically Botox injections would need to be repeated every 3-12 months since this is not a permanent therapy.  Sacral neuromodulation trial (Medtronic lnterStim or Axonics implant). Sacral neuromodulation is FDA-approved for uncontrolled urinary urgency, urinary frequency, urinary urge incontinence, non-obstructive urinary retention, or fecal incontinence. It is not FDA-approved as a treatment for pain. The goal of this therapy is at least a 50% improvement in symptoms. It is NOT realistic to expect a 100% cure. This is a a 2-step outpatient procedure. After a successful test period, a permanent wire and generator are placed in the OR. We discussed the risk of infection. We reviewed the fact that about 30% of patients fail the test phase and are not candidates for permanent generator placement. During the 1-2 week trial phase, symptoms are documented by the patient to determine response. If patient gets at least a 50% improvement in symptoms, they may then proceed with Step 2. Step 1: Trial lead placement. Per physician discretion, may done one of two ways: Percutaneous nerve evaluation (PNE) in the [facility] urology office. Performed by urologist under local anesthesia (numbing the area with lidocaine) using a spinal needle  for placement of test wire, which usually stays in place for 5-7 days to determine therapy response. Test lead placement in OR under anesthesia. Usually stays in place 2 weeks to determine therapy response. > Step 2: Permanent implantation of sacral neuromodulation device, which is performed in the OR.  Sacral neuromodulation implants: All are conditionally MRI safe. Manufacturer: Medtronic Website: [employer]gl therapy/right-for-you.html Options: lnterStim X: Non-rechargeable. The battery lasts 10 years on average. lnterStim Micro: Rechargeable. The battery lasts 15 years on average and must be charged routinely. Approximately 50% smaller implant than lnterStim X implant.  Manufacturer: Axonics Website: Findrealrelief.axonics.com Options: Non-rechargeable (Axonics F15): The battery lasts 15 years on average. Rechargeable (Axonics R20): The battery lasts 20 years on average and must be charged in office for about 1 hour every 6-10 months on average. Approximately 50% smaller implant than Axonics non-rechargeable implant.  Note: Generally the rechargeable devices are only advised for very small or thin patients who may not have sufficient adipose tissue to comfortably overlay the implanted device.  Suprapubic catheter (SP tube) placement.  Only done in severely refractory OAB when all other options have failed or are not a viable treatment choice depending on patient factors. Involves placement of a catheter through the lower abdomen into the bladder to continuously drain the bladder into an external collection bag, which patient can then empty at their convenience every few hours. Done via an outpatient surgical procedure in the OR under anesthesia. Risks may included but are not limited to: surgical site pain, infections, skin irritation / breakdown, chronic bacteriuria, symptomatic UTls. The SP tube must stay  in place continuously. This is a reversible procedure however - the insertion site will close if catheter is removed for more than a few hours. The SP tube must be exchanged routinely every 4 weeks to prevent the catheter from becoming clogged with sediment. SP tube exchanges are typically performed at a urology nurse visit or by a home health nurse.   Electronically signed by:  Donnita Falls, MSN, FNP-C, CUNP 03/01/2023 11:13 AM

## 2023-03-01 ENCOUNTER — Encounter: Payer: Self-pay | Admitting: Urology

## 2023-03-01 ENCOUNTER — Ambulatory Visit: Payer: Medicare HMO | Admitting: Urology

## 2023-03-01 VITALS — BP 133/71 | HR 76 | Temp 97.9°F

## 2023-03-01 DIAGNOSIS — R32 Unspecified urinary incontinence: Secondary | ICD-10-CM

## 2023-03-01 DIAGNOSIS — R3915 Urgency of urination: Secondary | ICD-10-CM

## 2023-03-01 DIAGNOSIS — Z789 Other specified health status: Secondary | ICD-10-CM

## 2023-03-01 DIAGNOSIS — N3943 Post-void dribbling: Secondary | ICD-10-CM | POA: Diagnosis not present

## 2023-03-01 DIAGNOSIS — R350 Frequency of micturition: Secondary | ICD-10-CM | POA: Diagnosis not present

## 2023-03-01 DIAGNOSIS — N138 Other obstructive and reflux uropathy: Secondary | ICD-10-CM

## 2023-03-01 DIAGNOSIS — N401 Enlarged prostate with lower urinary tract symptoms: Secondary | ICD-10-CM | POA: Diagnosis not present

## 2023-03-01 LAB — URINALYSIS, ROUTINE W REFLEX MICROSCOPIC
Bilirubin, UA: NEGATIVE
Glucose, UA: NEGATIVE
Ketones, UA: NEGATIVE
Leukocytes,UA: NEGATIVE
Nitrite, UA: NEGATIVE
Protein,UA: NEGATIVE
RBC, UA: NEGATIVE
Specific Gravity, UA: <1.005 — ABNORMAL LOW (ref 1.005–1.030)
Urobilinogen, Ur: 0.2 mg/dL (ref 0.2–1.0)
pH, UA: 6 (ref 5.0–7.5)

## 2023-03-01 LAB — BLADDER SCAN AMB NON-IMAGING: Scan Result: 106

## 2023-03-01 MED ORDER — MIRABEGRON ER 25 MG PO TB24: 25.0000 mg | ORAL_TABLET | Freq: Every day | ORAL | 11 refills | Status: AC

## 2023-03-01 NOTE — Patient Instructions (Signed)
Overactive bladder (OAB) overview for patients:  Symptoms may include: urinary urgency ("gotta go" feeling) urinary frequency (voiding >8 times per day) night time urination (nocturia) urge incontinence of urine (UUI)  While we do not know the exact etiology of OAB, several treatment options exist including:  Behavioral therapy: Reducing fluid intake Decreasing bladder stimulants (such as caffeine) and irritants (such as acidic food, spicy foods, alcohol) Urge suppression strategies Bladder retraining via timed voiding Pelvic floor physical therapy  Medication(s) - can use one or both of the drug classes below. Anticholinergic / antimuscarinic medications:  Mechanism of action: Activate M3 receptors to reduce detrusor stimulation and increase bladder capacity  (parasympathetic nervous system). Effect: Relaxes the bladder to decrease overactivity, increase bladder storage capacity, and increase time between voids. Onset: Slow acting (may take 8-12 weeks to determine efficacy). Medications include: Vesicare (Solifenacin), Ditropan (Oxybutynin), Detrol (Tolterodine), Toviaz (Fesoterodine), Sanctura (Trospium), Urispas (Flavoxate), Enablex (Darifenacin), Bentyl (Dicyclomine), Levsin (Hyoscyamine ). Potential side effects include but are not limited to: Dry eyes, dry mouth, constipation, cognitive impairment, dementia risk with long term use, and urinary retention/ incomplete bladder emptying. Insurance companies generally prefer for patients to try 1-2 anticholinergic / antimuscarinic medications first due to low cost. Some exceptions are made based on patient-specific comorbidities / risk factors. Beta-3 agonist medications: Mechanism of action: Stimulates selective B3 adrenergic receptors to cause smooth muscle bladder relaxation (sympathetic nervous system). Effect: Relaxes the bladder to decrease overactivity, increase bladder storage capacity, and increase time between voids. Onset:  Slow acting (may take 8-12 weeks to determine efficacy). Medications include: Myrbetriq (Mirabegron) and Vibegron (Gemtesa). Potential side effects include but are not limited to: urinary retention / incomplete bladder emptying and elevated blood pressure (more likely to occur in individuals with pre-existing uncontrolled hypertension). These medications tend to be more expensive than the anticholinergic / antimuscarinic medications.   For patients with refractory OAB (if the above treatment options have been unsuccessful): Posterior tibial nerve stimulation (PTNS). Small acupuncture-type needle inserted near ankle with electric current to stimulate bladder via posterior tibial nerve pathway. Initially requires 12 weekly in-office treatments lasting 30 minutes each; followed by monthly in-office treatments lasting 30 minutes each for 1 year.  Bladder Botox injections. How it is done: Typically done via in-office cystoscopy; sometimes done in the OR depending on the situation. The bladder is numbed with lidocaine instilled via a catheter. Then the urologist injects Botox into the bladder muscle wall in about 20 locations. Causes local paralysis of the bladder muscle at the injection sites to reduce bladder muscle overactivity / spasms. The effect lasts for approximately 6 months and cannot be reversed once performed. Risks may included but are not limited to: infection, incomplete bladder emptying/ urinary retention, short term need for self-catheterization or indwelling catheter, and need for repeat therapy. There is a 5-12% chance of needing to catheterize with Botox - that usually resolves in a few months as the Botox wears off. Typically Botox injections would need to be repeated every 3-12 months since this is not a permanent therapy.  Sacral neuromodulation trial (Medtronic lnterStim or Axonics implant). Sacral neuromodulation is FDA-approved for uncontrolled urinary urgency, urinary frequency,  urinary urge incontinence, non-obstructive urinary retention, or fecal incontinence. It is not FDA-approved as a treatment for pain. The goal of this therapy is at least a 50% improvement in symptoms. It is NOT realistic to expect a 100% cure. This is a a 2-step outpatient procedure. After a successful test period, a permanent wire and generator are placed   in the OR. We discussed the risk of infection. We reviewed the fact that about 30% of patients fail the test phase and are not candidates for permanent generator placement. During the 1-2 week trial phase, symptoms are documented by the patient to determine response. If patient gets at least a 50% improvement in symptoms, they may then proceed with Step 2. Step 1: Trial lead placement. Per physician discretion, may done one of two ways: Percutaneous nerve evaluation (PNE) in the [place] urology office. Performed by urologist under local anesthesia (numbing the area with lidocaine) using a spinal needle for placement of test wire, which usually stays in place for 5-7 days to determine therapy response. Test lead placement in OR under anesthesia. Usually stays in place 2 weeks to determine therapy response. > Step 2: Permanent implantation of sacral neuromodulation device, which is performed in the OR.  Sacral neuromodulation implants: All are conditionally MRI safe. Manufacturer: Medtronic Website: www.medtronic.com/[place]/patients/treatments-therapies/neurostimulator-overactive-bladder/getting therapy/right-for-you.html Options: lnterStim X: Non-rechargeable. The battery lasts 10 years on average. lnterStim Micro: Rechargeable. The battery lasts 15 years on average and must be charged routinely. Approximately 50% smaller implant than lnterStim X implant.  Manufacturer: Axonics Website: Findrealrelief.axonics.com Options: Non-rechargeable (Axonics F15): The battery lasts 15 years on average. Rechargeable (Axonics R20): The battery lasts 20 years on  average and must be charged in office for about 1 hour every 6-10 months on average. Approximately 50% smaller implant than Axonics non-rechargeable implant.  Note: Generally the rechargeable devices are only advised for very small or thin patients who may not have sufficient adipose tissue to comfortably overlay the implanted device.  Suprapubic catheter (SP tube) placement. Only done in severely refractory OAB when all other options have failed or are not a viable treatment choice depending on patient factors. Involves placement of a catheter through the lower abdomen into the bladder to continuously drain the bladder into an external collection bag, which patient can then empty at their convenience every few hours. Done via an outpatient surgical procedure in the OR under anesthesia. Risks may included but are not limited to: surgical site pain, infections, skin irritation / breakdown, chronic bacteriuria, symptomatic UTls. The SP tube must stay in place continuously. This is a reversible procedure however - the insertion site will close if catheter is removed for more than a few hours. The SP tube must be exchanged routinely every 4 weeks to prevent the catheter from becoming clogged with sediment. SP tube exchanges are typically performed at a urology nurse visit or by a home health nurse.  

## 2023-03-01 NOTE — Progress Notes (Signed)
Uroflow  Peak Flow: 22ml Average Flow: 6ml Voided Volume: Voiding Time: 167sec Flow Time: 48sec Time to Peak Flow: 8sec  PVR Volume: 

## 2023-04-03 ENCOUNTER — Encounter: Payer: Self-pay | Admitting: Urology

## 2023-04-03 ENCOUNTER — Ambulatory Visit: Payer: Medicare HMO | Admitting: Urology

## 2023-04-03 VITALS — BP 117/67 | HR 77

## 2023-04-03 DIAGNOSIS — N138 Other obstructive and reflux uropathy: Secondary | ICD-10-CM

## 2023-04-03 DIAGNOSIS — N401 Enlarged prostate with lower urinary tract symptoms: Secondary | ICD-10-CM

## 2023-04-03 LAB — BLADDER SCAN AMB NON-IMAGING: Scan Result: 151

## 2023-04-03 NOTE — Progress Notes (Signed)
post void residual=151 ?

## 2023-04-03 NOTE — Addendum Note (Signed)
Addended byEvette Georges on: 04/03/2023 04:47 PM   Modules accepted: Orders

## 2023-04-03 NOTE — Progress Notes (Addendum)
Name: Hunter Cuevas DOB: 1950-10-21 MRN: [id number]  History of Present Illness: Mr. Lutes is a 72 y.o. male who presents today for return patient visit at [facility] [place]. - GU history: 1. BPH with LUTS (frequency 9-10x/day, nocturia 1-2x/night, urgency, post-void dribbling). - Likely exacerbated by intake of bladder irritants including caffeine, acidic foods, spicy foods, and excessive alcohol use. - Denies family history of prostate cancer.  - Previously took Flomax and Avodart per PCP records but patient states at least one of those made him dizzy.  At initial visit with on 03/01/2023 the plan was: Start Myrbetriq 25 mg daily. Minimize alcohol intake. Minimize intake of spicy and acidic foods. Minimize caffeine intake.  Today: He reports symptom improvement since start Myrbetriq and cutting out caffeine. Reports voiding 1x/night and fewer times per day. Continues to drink >3 beers per day and acknowledges that makes him urinate more often. He denies urinary hesitancy, straining to void, or sensations of incomplete emptying.   Fall Screening: Do you usually have a device to assist in your mobility? No   Medications: Current Outpatient Medications  Medication Sig Dispense Refill   gabapentin (NEURONTIN) 100 MG capsule Take 100 mg by mouth 3 (three) times daily.     ALPRAZolam (XANAX) 1 MG tablet Take 1 mg by mouth 3 (three) times daily as needed (anxiety with crowds).     ascorbic acid (VITAMIN C) 500 MG tablet Take 500 mg by mouth in the morning.     aspirin EC 81 MG tablet Take 81 mg by mouth in the morning. Swallow whole.     Cholecalciferol (VITAMIN D3) 125 MCG (5000 UT) TABS Take 5,000 Units by mouth in the morning.     Coenzyme Q10 (COQ10) 100 MG CAPS Take 100 mg by mouth in the morning.     L-Lysine 500 MG TABS Take 500 mg by mouth in the morning.     losartan (COZAAR) 50 MG tablet Take 50 mg by mouth at bedtime.     lovastatin (MEVACOR) 40 MG tablet  Take 40 mg by mouth at bedtime.     mirabegron ER (MYRBETRIQ) 25 MG TB24 tablet Take 1 tablet (25 mg total) by mouth daily. 30 tablet 11   Omega-3 Fatty Acids (FISH OIL PO) Take 2,000 mg by mouth in the morning.     pantoprazole (PROTONIX) 40 MG tablet Take 40 mg by mouth daily as needed (ulcer upset).     Potassium 99 MG TABS Take 99 mg by mouth in the morning.     valACYclovir (VALTREX) 500 MG tablet Take 500 mg by mouth at bedtime.     No current facility-administered medications for this visit.    Allergies: Allergies  Allergen Reactions   Penicillins Hives    Has patient had a PCN reaction causing immediate rash, facial/tongue/throat swelling, SOB or lightheadedness with hypotension: No Has patient had a PCN reaction causing severe rash involving mucus membranes or skin necrosis: No Has patient had a PCN reaction that required hospitalization No Has patient had a PCN reaction occurring within the last 10 years: No If all of the above answers are "NO", then may proceed with Cephalosporin use.    Past Medical History:  Diagnosis Date   BPH (benign prostatic hyperplasia)    GERD (gastroesophageal reflux disease)    Hemorrhoids    HTN (hypertension)    Hyperlipidemia, mild    Past Surgical History:  Procedure Laterality Date   CIRCUMCISION  COLONOSCOPY WITH PROPOFOL N/A 12/05/2022   Procedure: COLONOSCOPY WITH PROPOFOL;  Surgeon: Lanelle Bal, DO;  [facility];  Service: Endoscopy;  Laterality: N/A;  1:00 pm   HAND SURGERY     R and L   HEMORRHOID SURGERY     HEMOSTASIS CLIP PLACEMENT  12/05/2022   Procedure: HEMOSTASIS CLIP PLACEMENT;  Surgeon: Lanelle Bal, DO;  Location: [facility];  Service: Endoscopy;;   POLYPECTOMY  12/05/2022   Procedure: POLYPECTOMY;  Surgeon: Lanelle Bal, DO;  Location: [facility];  Service: Endoscopy;;   SUBMUCOSAL LIFTING INJECTION  12/05/2022   Procedure: SUBMUCOSAL LIFTING INJECTION;  Surgeon: Lanelle Bal,  DO;  Location: [facility];  Service: Endoscopy;;   UMBILICAL HERNIA REPAIR     Family History  Problem Relation Age of Onset   Colon cancer Neg Hx    Social History   Socioeconomic History   Marital status: Married    Spouse name: Not on file   Number of children: Not on file   Years of education: Not on file   Highest education level: Not on file  Occupational History   Not on file  Tobacco Use   Smoking status: Every Day    Packs/day: 0.50    Years: 10.00    Additional pack years: 0.00    Total pack years: 5.00    Types: Cigarettes   Smokeless tobacco: Never  Substance and Sexual Activity   Alcohol use: Yes    Alcohol/week: 3.0 standard drinks of alcohol    Types: 3 Cans of beer per week    Comment: About 3 a day   Drug use: No   Sexual activity: Yes  Other Topics Concern   Not on file  Social History Narrative   Not on file   Social Determinants of Health   Financial Resource Strain: Not on file  Food Insecurity: Not on file  Transportation Needs: Not on file  Physical Activity: Not on file  Stress: Not on file  Social Connections: Not on file  Intimate Partner Violence: Not on file    Review of Systems Constitutional: Patient denies any unintentional weight loss or change in strength lntegumentary: Patient denies any rashes or pruritus Cardiovascular: Patient denies chest pain or syncope Respiratory: Patient denies shortness of breath Gastrointestinal: Patient denies nausea, vomiting, constipation, or diarrhea Musculoskeletal: Patient denies muscle cramps or weakness Neurologic: Patient reports BLE neuropathy Psychiatric: Patient denies memory problems Allergic/Immunologic: Patient denies recent allergic reaction(s) Hematologic/Lymphatic: Patient denies bleeding tendencies Endocrine: Patient denies heat/cold intolerance  GU: As per HPI.  OBJECTIVE Vitals:   04/03/23 1525  BP: 117/67  Pulse: 77   There is no height or weight on file to  calculate BMI.  Physical Examination  Constitutional: No obvious distress; patient is non-toxic appearing  Cardiovascular: No visible lower extremity edema.  Respiratory: The patient does not have audible wheezing/stridor; respirations do not appear labored  Gastrointestinal: Abdomen non-distended Musculoskeletal: Normal ROM of UEs  Skin: No obvious rashes/open sores  Neurologic: CN 2-12 grossly intact Psychiatric: Answered questions appropriately with normal affect  Hematologic/Lymphatic/Immunologic: No obvious bruises or sites of spontaneous bleeding  UA: negative for  PVR: 151 ml  ASSESSMENT BPH with obstruction/lower urinary tract symptoms - Plan: BLADDER SCAN AMB NON-IMAGING, Urinalysis, Routine w reflex microscopic  Doing well. Advised to continue Myrbetriq and behavioral modifications including minimization of bladder irritants and doing double voiding. Will plan for follow up in 6 months or sooner if needed.  Pt verbalized understanding and agreement. All questions were answered.  PLAN Advised the following: 1. Continue Myrbetriq.  2. Continue behavioral modifications including minimization of bladder irritants (alcohol,  spicy and acidic foods, caffeine). 3. Double voiding.  4. Return in about 6 months (around 10/04/2023) for UA, PVR, & f/u with Evette Georges NP.  Orders Placed This Encounter  Procedures   Urinalysis, Routine w reflex microscopic   BLADDER SCAN AMB NON-IMAGING    It has been explained that the patient is to follow regularly with their PCP in addition to all other providers involved in their care and to follow instructions provided by these respective offices. Patient advised to contact urology clinic if any urologic-pertaining questions, concerns, new symptoms or problems arise in the interim period.  There are no Patient Instructions on file for this visit.  Electronically signed by:  Donnita Falls, FNP   04/03/23    4:47 PM

## 2023-04-04 LAB — URINALYSIS, ROUTINE W REFLEX MICROSCOPIC
Bilirubin, UA: NEGATIVE
Glucose, UA: NEGATIVE
Ketones, UA: NEGATIVE
Leukocytes,UA: NEGATIVE
Nitrite, UA: NEGATIVE
Protein,UA: NEGATIVE
RBC, UA: NEGATIVE
Specific Gravity, UA: <1.005 — ABNORMAL LOW (ref 1.005–1.030)
Urobilinogen, Ur: 0.2 mg/dL (ref 0.2–1.0)
pH, UA: 6 (ref 5.0–7.5)

## 2023-05-07 ENCOUNTER — Encounter: Payer: Self-pay | Admitting: *Deleted

## 2023-05-24 ENCOUNTER — Telehealth: Payer: Self-pay | Admitting: *Deleted

## 2023-05-24 NOTE — Telephone Encounter (Signed)
Procedure: Colonoscopy  Height: 5'9 Weight: 185lbs        Have you had a colonoscopy before?  12/05/22 Dr. Marletta Lor  Do you have family history of colon cancer?  no  Do you have a family history of polyps? no  Previous colonoscopy with polyps removed? yes  Do you have a history colorectal cancer?   no  Are you diabetic?  no  Do you have a prosthetic or mechanical heart valve? no  Do you have a pacemaker/defibrillator?   no  Have you had endocarditis/atrial fibrillation?  no  Do you use supplemental oxygen/CPAP?  no  Have you had joint replacement within the last 12 months?  no  Do you tend to be constipated or have to use laxatives?  no   Do you have history of alcohol use? If yes, how much and how often.  no  Do you have history or are you using drugs? If yes, what do are you  using?  no  Have you ever had a stroke/heart attack?  no  Have you ever had a heart or other vascular stent placed,?no  Do you take weight loss medication? no   Do you take any blood-thinning medications such as: (Plavix, aspirin, Coumadin, Aggrenox, Brilinta, Xarelto, Eliquis, Pradaxa, Savaysa or Effient)? Aspirin 81mg   If yes we need the name, milligram, dosage and who is prescribing doctor:               Current Outpatient Medications  Medication Sig Dispense Refill   ALPRAZolam (XANAX) 1 MG tablet Take 1 mg by mouth 3 (three) times daily as needed (anxiety with crowds).     ascorbic acid (VITAMIN C) 500 MG tablet Take 500 mg by mouth in the morning.     aspirin EC 81 MG tablet Take 81 mg by mouth in the morning. Swallow whole.     Cholecalciferol (VITAMIN D3) 125 MCG (5000 UT) TABS Take 5,000 Units by mouth in the morning.     Coenzyme Q10 (COQ10) 100 MG CAPS Take 100 mg by mouth in the morning.     gabapentin (NEURONTIN) 100 MG capsule Take 100 mg by mouth 3 (three) times daily.     L-Lysine 500 MG TABS Take 500 mg by mouth in the morning.     losartan (COZAAR) 50 MG tablet Take 50 mg by  mouth at bedtime.     lovastatin (MEVACOR) 40 MG tablet Take 40 mg by mouth at bedtime.     mirabegron ER (MYRBETRIQ) 25 MG TB24 tablet Take 1 tablet (25 mg total) by mouth daily. 30 tablet 11   Omega-3 Fatty Acids (FISH OIL PO) Take 2,000 mg by mouth in the morning.     pantoprazole (PROTONIX) 40 MG tablet Take 40 mg by mouth daily as needed (ulcer upset).     Potassium 99 MG TABS Take 99 mg by mouth in the morning.     valACYclovir (VALTREX) 500 MG tablet Take 500 mg by mouth at bedtime.     No current facility-administered medications for this visit.    Allergies  Allergen Reactions   Penicillins Hives    Has patient had a PCN reaction causing immediate rash, facial/tongue/throat swelling, SOB or lightheadedness with hypotension: No Has patient had a PCN reaction causing severe rash involving mucus membranes or skin necrosis: No Has patient had a PCN reaction that required hospitalization No Has patient had a PCN reaction occurring within the last 10 years: No If all of the above answers  are "NO", then may proceed with Cephalosporin use.

## 2023-06-05 NOTE — Telephone Encounter (Signed)
Noted, will call once we have future schedule to schedule

## 2023-06-05 NOTE — Telephone Encounter (Signed)
Okay to schedule. ASA 2 

## 2023-06-12 MED ORDER — PEG 3350-KCL-NA BICARB-NACL 420 G PO SOLR: 4000.0000 mL | Freq: Once | ORAL | 0 refills | Status: AC

## 2023-06-12 NOTE — Telephone Encounter (Signed)
Called pt. He has been scheduled for 10/1. Aware will send instructions. Rx for prep sent to pharmacy  [place] [employer]here. Authorization #[id number], DOS: 06/26/2023 - 08/26/2023

## 2023-06-12 NOTE — Addendum Note (Signed)
Addended by: Armstead Peaks on: 06/12/2023 01:09 PM   Modules accepted: Orders

## 2023-06-13 NOTE — Telephone Encounter (Signed)
Questionnaire from recall, no referral needed  

## 2023-06-26 ENCOUNTER — Ambulatory Visit (HOSPITAL_COMMUNITY): Payer: Medicare HMO | Admitting: Certified Registered"

## 2023-06-26 ENCOUNTER — Ambulatory Visit (HOSPITAL_COMMUNITY)
Admission: RE | Admit: 2023-06-26 | Discharge: 2023-06-26 | Disposition: A | Discharge status: Home / Self Care | Payer: Medicare HMO | Attending: Internal Medicine | Admitting: Internal Medicine

## 2023-06-26 ENCOUNTER — Encounter (HOSPITAL_COMMUNITY)
Admission: RE | Disposition: A | Discharge status: Home / Self Care | Payer: Self-pay | Source: Home / Self Care | Attending: Internal Medicine

## 2023-06-26 ENCOUNTER — Ambulatory Visit (HOSPITAL_BASED_OUTPATIENT_CLINIC_OR_DEPARTMENT_OTHER): Payer: Medicare HMO | Admitting: Certified Registered"

## 2023-06-26 ENCOUNTER — Other Ambulatory Visit: Payer: Self-pay

## 2023-06-26 ENCOUNTER — Encounter (HOSPITAL_COMMUNITY): Payer: Self-pay

## 2023-06-26 DIAGNOSIS — D125 Benign neoplasm of sigmoid colon: Secondary | ICD-10-CM

## 2023-06-26 DIAGNOSIS — D126 Benign neoplasm of colon, unspecified: Secondary | ICD-10-CM

## 2023-06-26 DIAGNOSIS — Z1211 Encounter for screening for malignant neoplasm of colon: Secondary | ICD-10-CM | POA: Diagnosis not present

## 2023-06-26 DIAGNOSIS — D120 Benign neoplasm of cecum: Secondary | ICD-10-CM | POA: Insufficient documentation

## 2023-06-26 DIAGNOSIS — K219 Gastro-esophageal reflux disease without esophagitis: Secondary | ICD-10-CM | POA: Diagnosis not present

## 2023-06-26 DIAGNOSIS — K5730 Diverticulosis of large intestine without perforation or abscess without bleeding: Secondary | ICD-10-CM | POA: Diagnosis not present

## 2023-06-26 DIAGNOSIS — D122 Benign neoplasm of ascending colon: Secondary | ICD-10-CM

## 2023-06-26 DIAGNOSIS — K9189 Other postprocedural complications and disorders of digestive system: Secondary | ICD-10-CM

## 2023-06-26 DIAGNOSIS — D123 Benign neoplasm of transverse colon: Secondary | ICD-10-CM | POA: Insufficient documentation

## 2023-06-26 DIAGNOSIS — I10 Essential (primary) hypertension: Secondary | ICD-10-CM | POA: Insufficient documentation

## 2023-06-26 DIAGNOSIS — Z860100 Personal history of colon polyps, unspecified: Secondary | ICD-10-CM

## 2023-06-26 DIAGNOSIS — K648 Other hemorrhoids: Secondary | ICD-10-CM

## 2023-06-26 DIAGNOSIS — F17210 Nicotine dependence, cigarettes, uncomplicated: Secondary | ICD-10-CM | POA: Diagnosis not present

## 2023-06-26 DIAGNOSIS — Z860101 Personal history of adenomatous and serrated colon polyps: Secondary | ICD-10-CM | POA: Diagnosis not present

## 2023-06-26 DIAGNOSIS — K635 Polyp of colon: Secondary | ICD-10-CM | POA: Diagnosis not present

## 2023-06-26 HISTORY — PX: COLONOSCOPY WITH PROPOFOL: SHX5780

## 2023-06-26 HISTORY — PX: BIOPSY: SHX5522

## 2023-06-26 HISTORY — PX: POLYPECTOMY: SHX5525

## 2023-06-26 SURGERY — COLONOSCOPY WITH PROPOFOL
Anesthesia: General

## 2023-06-26 MED ORDER — LACTATED RINGERS IV SOLN: INTRAVENOUS | Status: DC

## 2023-06-26 MED ORDER — LIDOCAINE HCL (CARDIAC) PF 100 MG/5ML IV SOSY
PREFILLED_SYRINGE | INTRAVENOUS | Status: DC | PRN
  Administered 2023-06-26: 50 mg via INTRAVENOUS

## 2023-06-26 MED ORDER — PROPOFOL 10 MG/ML IV BOLUS
INTRAVENOUS | Status: DC | PRN
  Administered 2023-06-26 (×3): 50 mg via INTRAVENOUS
  Administered 2023-06-26: 100 mg via INTRAVENOUS
  Administered 2023-06-26 (×2): 50 mg via INTRAVENOUS

## 2023-06-26 MED ORDER — LACTATED RINGERS IV SOLN: INTRAVENOUS | Status: DC | PRN

## 2023-06-26 MED ORDER — STERILE WATER FOR IRRIGATION IR SOLN
Status: DC | PRN
  Administered 2023-06-26: 60 mL

## 2023-06-26 NOTE — Discharge Instructions (Addendum)
  Colonoscopy Discharge Instructions  Read the instructions outlined below and refer to this sheet in the next few weeks. These discharge instructions provide you with general information on caring for yourself after you leave the hospital. Your doctor may also give you specific instructions. While your treatment has been planned according to the most current medical practices available, unavoidable complications occasionally occur.   ACTIVITY You may resume your regular activity, but move at a slower pace for the next 24 hours.  Take frequent rest periods for the next 24 hours.  Walking will help get rid of the air and reduce the bloated feeling in your belly (abdomen).  No driving for 24 hours (because of the medicine (anesthesia) used during the test).   Do not sign any important legal documents or operate any machinery for 24 hours (because of the anesthesia used during the test).  NUTRITION Drink plenty of fluids.  You may resume your normal diet as instructed by your doctor.  Begin with a light meal and progress to your normal diet. Heavy or fried foods are harder to digest and may make you feel sick to your stomach (nauseated).  Avoid alcoholic beverages for 24 hours or as instructed.  MEDICATIONS You may resume your normal medications unless your doctor tells you otherwise.  WHAT YOU CAN EXPECT TODAY Some feelings of bloating in the abdomen.  Passage of more gas than usual.  Spotting of blood in your stool or on the toilet paper.  IF YOU HAD POLYPS REMOVED DURING THE COLONOSCOPY: No aspirin products for 7 days or as instructed.  No alcohol for 7 days or as instructed.  Eat a soft diet for the next 24 hours.  FINDING OUT THE RESULTS OF YOUR TEST Not all test results are available during your visit. If your test results are not back during the visit, make an appointment with your caregiver to find out the results. Do not assume everything is normal if you have not heard from your  caregiver or the medical facility. It is important for you to follow up on all of your test results.  SEEK IMMEDIATE MEDICAL ATTENTION IF: You have more than a spotting of blood in your stool.  Your belly is swollen (abdominal distention).  You are nauseated or vomiting.  You have a temperature over 101.  You have abdominal pain or discomfort that is severe or gets worse throughout the day.   Previous polypectomy scar looked good without residual polyp. I did take samples of the scar to be sure.   I removed 5 other small polyps, we will call with these results.   Repeat colonoscopy in 3 years.   You also have diverticulosis and internal hemorrhoids. I would recommend increasing fiber in your diet or adding OTC Benefiber/Metamucil. Be sure to drink at least 4 to 6 glasses of water daily.   Follow-up with GI as needed.   I hope you have a great rest of your week!  Hennie Duos. Marletta Lor, D.O. Gastroenterology and Hepatology [facility]

## 2023-06-26 NOTE — Anesthesia Postprocedure Evaluation (Signed)
Anesthesia Post Note  Patient: Hunter Cuevas  Procedure(s) Performed: COLONOSCOPY WITH PROPOFOL POLYPECTOMY BIOPSY  Patient location during evaluation: PACU Anesthesia Type: General Level of consciousness: awake and alert Pain management: pain level controlled Vital Signs Assessment: post-procedure vital signs reviewed and stable Respiratory status: spontaneous breathing, nonlabored ventilation, respiratory function stable and patient connected to nasal cannula oxygen Cardiovascular status: blood pressure returned to baseline and stable Postop Assessment: no apparent nausea or vomiting Anesthetic complications: no   There were no known notable events for this encounter.   Last Vitals:  Vitals:   06/26/23 1142 06/26/23 1145  BP:  111/80  Pulse: 65   Resp: 18   Temp: 36.6 C   SpO2: 98%     Last Pain:  Vitals:   06/26/23 1142  TempSrc: Oral  PainSc: 0-No pain                 Kacia Halley L Elva Breaker

## 2023-06-26 NOTE — Anesthesia Procedure Notes (Signed)
Date/Time: 06/26/2023 11:11 AM  Performed by: Julian Reil, CRNAPre-anesthesia Checklist: Patient identified, Emergency Drugs available, Suction available and Patient being monitored Patient Re-evaluated:Patient Re-evaluated prior to induction Oxygen Delivery Method: Nasal cannula Induction Type: IV induction Placement Confirmation: positive ETCO2

## 2023-06-26 NOTE — Transfer of Care (Signed)
Immediate Anesthesia Transfer of Care Note  Patient: Hunter Cuevas  Procedure(s) Performed: COLONOSCOPY WITH PROPOFOL POLYPECTOMY BIOPSY  Patient Location: Endoscopy Unit  Anesthesia Type:General  Level of Consciousness: awake  Airway & Oxygen Therapy: Patient Spontanous Breathing  Post-op Assessment: Report given to RN and Post -op Vital signs reviewed and stable  Post vital signs: Reviewed and stable  Last Vitals:  Vitals Value Taken Time  BP    Temp    Pulse    Resp    SpO2      Last Pain:  Vitals:   06/26/23 1108  TempSrc:   PainSc: 0-No pain      Patients Stated Pain Goal: 7 (06/26/23 0925)  Complications: No notable events documented.

## 2023-06-26 NOTE — Op Note (Addendum)
[facility] Patient Name: Hunter Cuevas Procedure Date: 06/26/2023 11:04 AM MRN: [id number] Date of Birth: 1950-10-31 Attending MD: Hennie Duos. Marletta Lor , [place], [id number] CSN: [id number] Age: 72 Admit Type: Outpatient Procedure:                Colonoscopy Indications:              High risk colon cancer surveillance: Personal                            history of adenoma (10 mm or greater in size) Providers:                Hennie Duos. Marletta Lor, DO, Crystal Page, Zena Amos Referring MD:              Medicines:                See the Anesthesia note for documentation of the                            administered medications Complications:            No immediate complications. Estimated Blood Loss:     Estimated blood loss was minimal. Procedure:                Pre-Anesthesia Assessment:                           - The anesthesia plan was to use monitored                            anesthesia care (MAC).                           After obtaining informed consent, the colonoscope                            was passed under direct vision. Throughout the                            procedure, the patient's blood pressure, pulse, and                            oxygen saturations were monitored continuously. The                            [id number]-HQ190L ([id number]) scope was introduced through                            the anus and advanced to the the cecum, identified                            by appendiceal orifice and ileocecal valve. The                            colonoscopy was performed  without difficulty. The                            patient tolerated the procedure well. The quality                            of the bowel preparation was evaluated using the                            BBPS [facility] Bowel Preparation Scale) with scores                            of: Right Colon = 3 (entire mucosa seen well with                            no residual  staining, small fragments of stool or                            opaque liquid), Transverse Colon = 2 (minor amount                            of residual staining, small fragments of stool                            and/or opaque liquid, but mucosa seen well) and                            Left Colon = 2 (minor amount of residual staining,                            small fragments of stool and/or opaque liquid, but                            mucosa seen well). The total BBPS score equals 7.                            The quality of the bowel preparation was good. Scope In: 11:13:36 AM Scope Out: 11:39:45 AM Scope Withdrawal Time: 0 hours 22 minutes 47 seconds  Total Procedure Duration: 0 hours 26 minutes 9 seconds  Findings:      Non-bleeding internal hemorrhoids were found during endoscopy.      Multiple large-mouthed and small-mouthed diverticula were found in the       sigmoid colon, transverse colon and ascending colon.      Four sessile polyps were found in the ascending colon and cecum. The       polyps were 3 to 7 mm in size. These polyps were removed with a cold       snare. Resection and retrieval were complete.      A 20 mm post polypectomy scar was found in the cecum. The scar tissue       was healthy in appearance. Biopsies were taken with a cold forceps for       histology.      A 6 mm polyp was found in the  transverse colon. The polyp was sessile.       The polyp was removed with a cold snare. Resection and retrieval were       complete. Impression:               - Non-bleeding internal hemorrhoids.                           - Diverticulosis in the sigmoid colon, in the                            transverse colon and in the ascending colon.                           - Four 3 to 7 mm polyps in the ascending colon and                            in the cecum, removed with a cold snare. Resected                            and retrieved.                           -  Post-polypectomy scar in the cecum. Biopsied.                           - One 6 mm polyp in the transverse colon, removed                            with a cold snare. Resected and retrieved. Moderate Sedation:      Per Anesthesia Care Recommendation:           - Patient has a contact number available for                            emergencies. The signs and symptoms of potential                            delayed complications were discussed with the                            patient. Return to normal activities tomorrow.                            Written discharge instructions were provided to the                            patient.                           - Resume previous diet.                           - Continue present medications.                           -  Await pathology results.                           - Repeat colonoscopy in 3 years for surveillance.                           - Return to GI clinic PRN. Procedure Code(s):        --- Professional ---                           [phone], Colonoscopy, flexible; with removal of                            tumor(s), polyp(s), or other lesion(s) by snare                            technique                           [id number], 59, Colonoscopy, flexible; with biopsy,                            single or multiple Diagnosis Code(s):        --- Professional ---                           Z86.010, Personal history of colonic polyps                           K64.8, Other hemorrhoids                           D12.2, Benign neoplasm of ascending colon                           D12.0, Benign neoplasm of cecum                           Z98.890, Other specified postprocedural states                           D12.3, Benign neoplasm of transverse colon (hepatic                            flexure or splenic flexure)                           K57.30, Diverticulosis of large intestine without                            perforation or abscess without  bleeding CPT copyright 2022 American [employer] Association. All rights reserved. The codes documented in this report are preliminary and upon coder review may  be revised to meet current compliance requirements. Hennie Duos. Marletta Lor, DO Hennie Duos. Tressia Labrum, DO 06/26/2023 11:46:04 AM This report has been signed electronically. Number of Addenda: 0

## 2023-06-26 NOTE — Anesthesia Preprocedure Evaluation (Signed)
Anesthesia Evaluation  Patient identified by MRN, date of birth, ID band Patient awake    Reviewed: Allergy & Precautions, H&P , NPO status , Patient's Chart, lab work & pertinent test results, reviewed documented beta blocker date and time   Airway Mallampati: II  TM Distance: >3 FB Neck ROM: full    Dental no notable dental hx.    Pulmonary neg pulmonary ROS, Current Smoker, former smoker   Pulmonary exam normal breath sounds clear to auscultation       Cardiovascular Exercise Tolerance: Good hypertension, negative cardio ROS Normal cardiovascular exam Rhythm:regular Rate:Normal     Neuro/Psych negative neurological ROS  negative psych ROS   GI/Hepatic negative GI ROS, Neg liver ROS,GERD  ,,  Endo/Other  negative endocrine ROS    Renal/GU negative Renal ROS  negative genitourinary   Musculoskeletal   Abdominal   Peds  Hematology negative hematology ROS (+)   Anesthesia Other Findings   Reproductive/Obstetrics negative OB ROS                             Anesthesia Physical Anesthesia Plan  ASA: 2  Anesthesia Plan: General   Post-op Pain Management: Minimal or no pain anticipated   Induction: Intravenous  PONV Risk Score and Plan: Propofol infusion  Airway Management Planned: Nasal Cannula and Natural Airway  Additional Equipment:   Intra-op Plan:   Post-operative Plan:   Informed Consent: I have reviewed the patients History and Physical, chart, labs and discussed the procedure including the risks, benefits and alternatives for the proposed anesthesia with the patient or authorized representative who has indicated his/her understanding and acceptance.     Dental Advisory Given  Plan Discussed with: CRNA  Anesthesia Plan Comments:        Anesthesia Quick Evaluation

## 2023-06-26 NOTE — H&P (Signed)
Primary Care Physician:  Elfredia Nevins, MD Primary Gastroenterologist:  Dr. Marletta Lor  Pre-Procedure History & Physical: HPI:  Hunter Cuevas is a 72 y.o. male is here  for a colonoscopy to be performed for surveillance purposes, personal history of large tubular adenoma of the cecum requiring EMR.   Past Medical History:  Diagnosis Date   BPH (benign prostatic hyperplasia)    GERD (gastroesophageal reflux disease)    Hemorrhoids    HTN (hypertension)    Hyperlipidemia, mild     Past Surgical History:  Procedure Laterality Date   CIRCUMCISION     COLONOSCOPY WITH PROPOFOL N/A 12/05/2022   Procedure: COLONOSCOPY WITH PROPOFOL;  Surgeon: Lanelle Bal, DO;  Location: [facility];  Service: Endoscopy;  Laterality: N/A;  1:00 pm   HAND SURGERY     R and L   HEMORRHOID SURGERY     HEMOSTASIS CLIP PLACEMENT  12/05/2022   Procedure: HEMOSTASIS CLIP PLACEMENT;  Surgeon: Lanelle Bal, DO;  Location: [facility];  Service: Endoscopy;;   POLYPECTOMY  12/05/2022   Procedure: POLYPECTOMY;  Surgeon: Lanelle Bal, DO;  Location: [facility];  Service: Endoscopy;;   SUBMUCOSAL LIFTING INJECTION  12/05/2022   Procedure: SUBMUCOSAL LIFTING INJECTION;  Surgeon: Lanelle Bal, DO;  Location: [facility];  Service: Endoscopy;;   UMBILICAL HERNIA REPAIR      Prior to Admission medications   Medication Sig Start Date End Date Taking? Authorizing Provider  ALPRAZolam Prudy Feeler) 1 MG tablet Take 1 mg by mouth 3 (three) times daily as needed (anxiety with crowds). 07/31/16  Yes [provider]  ascorbic acid (VITAMIN C) 500 MG tablet Take 500 mg by mouth in the morning.   Yes [provider]  aspirin EC 81 MG tablet Take 81 mg by mouth in the morning. Swallow whole.   Yes [provider]  Cholecalciferol (VITAMIN D3) 125 MCG (5000 UT) TABS Take 5,000 Units by mouth in the morning.   Yes [provider]  Coenzyme Q10 (COQ10) 100 MG CAPS Take 100 mg by  mouth in the morning.   Yes [provider]  gabapentin (NEURONTIN) 100 MG capsule Take 100 mg by mouth 3 (three) times daily. 03/19/23  Yes [provider]  L-Lysine 500 MG TABS Take 500 mg by mouth in the morning.   Yes [provider]  losartan (COZAAR) 50 MG tablet Take 50 mg by mouth at bedtime. 07/31/16  Yes [provider]  lovastatin (MEVACOR) 40 MG tablet Take 40 mg by mouth at bedtime. 07/31/16  Yes [provider]  mirabegron ER (MYRBETRIQ) 25 MG TB24 tablet Take 1 tablet (25 mg total) by mouth daily. 03/01/23  Yes Donnita Falls, FNP  Omega-3 Fatty Acids (FISH OIL PO) Take 2,000 mg by mouth in the morning.   Yes [provider]  pantoprazole (PROTONIX) 40 MG tablet Take 40 mg by mouth daily as needed (ulcer upset). 11/20/22  Yes [provider]  Potassium 99 MG TABS Take 99 mg by mouth in the morning.   Yes [provider]  valACYclovir (VALTREX) 500 MG tablet Take 500 mg by mouth at bedtime. 05/04/16  Yes [provider]    Allergies as of 06/12/2023 - Review Complete 05/24/2023  Allergen Reaction Noted   Penicillins Hives 08/07/2016    Family History  Problem Relation Age of Onset   Colon cancer Neg Hx     Social History   Socioeconomic History  Marital status: Married    Spouse name: Not on file   Number of children: Not on file   Years of education: Not on file   Highest education level: Not on file  Occupational History   Not on file  Tobacco Use   Smoking status: Every Day    Current packs/day: 0.50    Average packs/day: 0.5 packs/day for 10.0 years (5.0 ttl pk-yrs)    Types: Cigarettes   Smokeless tobacco: Never  Substance and Sexual Activity   Alcohol use: Yes    Alcohol/week: 3.0 standard drinks of alcohol    Types: 3 Cans of beer per week    Comment: About 3 a day   Drug use: No   Sexual activity: Yes  Other Topics Concern   Not on file  Social History Narrative   Not on  file   Social Determinants of Health   Financial Resource Strain: Not on file  Food Insecurity: Not on file  Transportation Needs: Not on file  Physical Activity: Not on file  Stress: Not on file  Social Connections: Not on file  Intimate Partner Violence: Not on file    Review of Systems: See HPI, otherwise negative ROS  Physical Exam: Vital signs in last 24 hours: Temp:  [97.9 F (36.6 C)] 97.9 F (36.6 C) (10/01 0925) Pulse Rate:  [56] 56 (10/01 0925) Resp:  [17] 17 (10/01 0925) BP: (160)/(59) 160/59 (10/01 0925) SpO2:  [97 %] 97 % (10/01 0925) Weight:  [85.3 kg] 85.3 kg (10/01 0925)   General:   Alert,  Well-developed, well-nourished, pleasant and cooperative in NAD Head:  Normocephalic and atraumatic. Eyes:  Sclera clear, no icterus.   Conjunctiva pink. Ears:  Normal auditory acuity. Nose:  No deformity, discharge,  or lesions. Msk:  Symmetrical without gross deformities. Normal posture. Extremities:  Without clubbing or edema. Neurologic:  Alert and  oriented x4;  grossly normal neurologically. Skin:  Intact without significant lesions or rashes. Psych:  Alert and cooperative. Normal mood and affect.  Impression/Plan: Hunter Cuevas is here for a colonoscopy to be performed for surveillance purposes, personal history of large tubular adenoma of the cecum requiring EMR.   The risks of the procedure including infection, bleed, or perforation as well as benefits, limitations, alternatives and imponderables have been reviewed with the patient. Questions have been answered. All parties agreeable.

## 2023-06-27 LAB — SURGICAL PATHOLOGY

## 2023-07-05 ENCOUNTER — Encounter (HOSPITAL_COMMUNITY): Payer: Self-pay | Admitting: Internal Medicine

## 2023-10-09 ENCOUNTER — Ambulatory Visit: Payer: Medicare HMO | Admitting: Urology

## 2024-06-16 ENCOUNTER — Ambulatory Visit: Payer: Self-pay | Admitting: *Deleted

## 2024-06-16 NOTE — Telephone Encounter (Signed)
 FYI Only or Action Required?: FYI only for provider.  Patient was last seen in primary care on no PCP.  Called Nurse Triage reporting Pain.  Symptoms began unclear, reports ongoing issues x 1 year but most recent pain and not sleeping last night .  Interventions attempted: Rest, hydration, or home remedies.  Symptoms are: gradually worsening.  Triage Disposition: See HCP Within 4 Hours (Or PCP Triage)  Patient/caregiver understands and will follow disposition?: Unsure   Recommended ED and if will not go to ED go to UC. Can apply cool compress under scrotum if tolerated if swelling occurs.             Copied from CRM #[id number]. Topic: Clinical - Red Word Triage >> Jun 16, 2024  1:24 PM Donee H wrote: [facility] that prompted transfer to Nurse Triage: Patient states experiencing pain in left leg from his groin area down to his calf. He states pain level is 8 1/2 but currently sitting down which helps some. He barely could sleep last night due to pain. Actually called in to set up an appointment as new pt with Gloria Zarwolo at [facility]. His previous pcp office closed. Reason for Disposition  [1] SEVERE pain (e.g., excruciating, unable to do any normal activities) AND [2] not improved after 2 hours of pain medicine  Answer Assessment - Initial Assessment Questions Recommended ED due to sx and no PCP.previous PCP office closed. Patient requesting to establish care at [facility]. First available appt with Zarwolo, FNP scheduled for 10/16/24. Unsure if patient will go to ED now or UC. Reports he needs to get sister to take him. Recommended patient to be seen today       1. ONSET: When did the pain start?      Last year, comes and goes. Unclear on when pain started  2. LOCATION: Where is the pain located?      Left leg from groin to calf, reports sitting on balls scrotum and pain worsening  3. PAIN: How bad is the pain?    (Scale 1-10; or mild, moderate, severe)      8 1/2/ 4. WORK OR EXERCISE: Has there been any recent work or exercise that involved this part of the body?      Sat on scrotum  5. CAUSE: What do you think is causing the leg pain?     Sat on scrotum  6. OTHER SYMPTOMS: Do you have any other symptoms? (e.g., chest pain, back pain, breathing difficulty, swelling, rash, fever, numbness, weakness)     Left leg groin to calf,  foot swelling at times. numbness , denies swelling , reports sitting on scrotum 7. PREGNANCY: Is there any chance you are pregnant? When was your last menstrual period?     na  Protocols used: Leg Pain-A-AH

## 2024-07-30 ENCOUNTER — Encounter (INDEPENDENT_AMBULATORY_CARE_PROVIDER_SITE_OTHER): Payer: Self-pay | Admitting: Gastroenterology

## 2024-09-04 ENCOUNTER — Other Ambulatory Visit: Payer: Self-pay | Admitting: Family Medicine

## 2024-09-04 NOTE — Telephone Encounter (Signed)
 Copied from CRM #[id number]. Topic: Clinical - Medication Refill >> Sep 04, 2024  9:32 AM Vanessa G wrote: Medication: lovastatin (MEVACOR) 40 MG tablet  Has the patient contacted their pharmacy? Yes, pharmacy calling in for patient (Agent: If no, request that the patient contact the pharmacy for the refill. If patient does not wish to contact the pharmacy document the reason why and proceed with request.) (Agent: If yes, when and what did the pharmacy advise?)  This is the patient's preferred pharmacy:  [facility] - [employer], [place] - [address] [place] [address] Phone: [phone] Fax: [phone]  Is this the correct pharmacy for this prescription? Yes If no, delete pharmacy and type the correct one.   Has the prescription been filled recently? No  Is the patient out of the medication? No  Has the patient been seen for an appointment in the last year OR does the patient have an upcoming appointment? Yes  Can we respond through MyChart? No  Agent: Please be advised that Rx refills may take up to 3 business days. We ask that you follow-up with your pharmacy.

## 2024-10-02 ENCOUNTER — Telehealth: Payer: Self-pay

## 2024-10-02 NOTE — Telephone Encounter (Signed)
 Added  to waitlist

## 2024-10-02 NOTE — Telephone Encounter (Signed)
 Copied from CRM [id number]. Topic: Clinical - Medication Question >> Oct 02, 2024  2:58 PM Chasity T wrote: Reason for CRM: Patient has a new pt appointment coming in May but states he will run out of his medication before the appointment and needs a refill before then for his bp and cholesterol. Wanting to know if he can get a temp refill until then. He is wanting a call back to know if you can refill.  lovastatin (MEVACOR) 40 MG table, losartan (COZAAR) 50 MG tablet

## 2024-10-16 ENCOUNTER — Ambulatory Visit: Payer: Self-pay | Admitting: Family Medicine

## 2024-10-30 ENCOUNTER — Telehealth: Payer: Self-pay

## 2024-10-30 NOTE — Telephone Encounter (Signed)
 Copied from CRM [id number]. Topic: Clinical - Medication Question >> Oct 02, 2024  2:58 PM Chasity T wrote: Reason for CRM: Patient has a new pt appointment coming in May but states he will run out of his medication before the appointment and needs a refill before then for his bp and cholesterol. Wanting to know if he can get a temp refill until then. He is wanting a call back to know if you can refill.  lovastatin  (MEVACOR ) 40 MG table, losartan (COZAAR) 50 MG tablet >> Oct 30, 2024  3:03 PM Emylou G wrote: Patient called.. still wants refill.. says our error we had to reschedule.. Can you speak to patient?

## 2024-10-31 ENCOUNTER — Other Ambulatory Visit: Payer: Self-pay

## 2024-10-31 MED ORDER — LOVASTATIN 40 MG PO TABS: 40.0000 mg | ORAL_TABLET | Freq: Every day | ORAL | 3 refills | Status: AC

## 2024-10-31 NOTE — Telephone Encounter (Signed)
 Spoke with patient. Aware that script for lovastatin  sent to [facility] in [place]

## 2024-10-31 NOTE — Telephone Encounter (Signed)
 The message did not specify the pharmacy, but I sent refills of the lovastatin  to [facility] pharmacy.

## 2025-02-04 ENCOUNTER — Ambulatory Visit: Payer: Self-pay
